# Patient Record
Sex: Male | Born: 1950 | Race: White | Hispanic: No | Marital: Married | Smoking: Never smoker
Health system: Southern US, Community
[De-identification: ages and names within clinical notes are randomized; demographics above are authoritative.]

## PROBLEM LIST (undated history)

## (undated) DIAGNOSIS — R350 Frequency of micturition: Secondary | ICD-10-CM

## (undated) DIAGNOSIS — Z8669 Personal history of other diseases of the nervous system and sense organs: Secondary | ICD-10-CM

## (undated) DIAGNOSIS — H9191 Unspecified hearing loss, right ear: Secondary | ICD-10-CM

## (undated) DIAGNOSIS — R351 Nocturia: Secondary | ICD-10-CM

## (undated) DIAGNOSIS — J45909 Unspecified asthma, uncomplicated: Secondary | ICD-10-CM

## (undated) DIAGNOSIS — Z860100 Personal history of colon polyps, unspecified: Secondary | ICD-10-CM

## (undated) DIAGNOSIS — R51 Headache: Secondary | ICD-10-CM

## (undated) DIAGNOSIS — R011 Cardiac murmur, unspecified: Secondary | ICD-10-CM

## (undated) DIAGNOSIS — F439 Reaction to severe stress, unspecified: Secondary | ICD-10-CM

## (undated) DIAGNOSIS — I10 Essential (primary) hypertension: Secondary | ICD-10-CM

## (undated) DIAGNOSIS — J189 Pneumonia, unspecified organism: Secondary | ICD-10-CM

## (undated) DIAGNOSIS — C61 Malignant neoplasm of prostate: Secondary | ICD-10-CM

## (undated) DIAGNOSIS — T148XXA Other injury of unspecified body region, initial encounter: Secondary | ICD-10-CM

## (undated) HISTORY — PX: TONSILLECTOMY: SUR1361

## (undated) HISTORY — PX: SKIN GRAFT: SHX250

## (undated) HISTORY — PX: BACK SURGERY: SHX140

## (undated) HISTORY — PX: OTHER SURGICAL HISTORY: SHX169

## (undated) HISTORY — PX: MYRINGOTOMY: SUR874

## (undated) HISTORY — PX: COLONOSCOPY: SHX174

## (undated) HISTORY — DX: Malignant neoplasm of prostate: C61

---

## 1995-11-01 HISTORY — PX: OTHER SURGICAL HISTORY: SHX169

## 1998-06-11 ENCOUNTER — Inpatient Hospital Stay (HOSPITAL_COMMUNITY): Admission: EM | Admit: 1998-06-11 | Discharge: 1998-06-12 | Payer: Self-pay | Admitting: *Deleted

## 1998-06-16 ENCOUNTER — Encounter: Admission: RE | Admit: 1998-06-16 | Discharge: 1998-09-14 | Payer: Self-pay | Admitting: Family Medicine

## 1998-08-03 ENCOUNTER — Encounter: Payer: Self-pay | Admitting: *Deleted

## 1998-08-05 ENCOUNTER — Encounter: Payer: Self-pay | Admitting: *Deleted

## 1998-08-05 ENCOUNTER — Ambulatory Visit (HOSPITAL_COMMUNITY): Admission: RE | Admit: 1998-08-05 | Discharge: 1998-08-06 | Payer: Self-pay | Admitting: *Deleted

## 2002-10-31 DIAGNOSIS — T148XXA Other injury of unspecified body region, initial encounter: Secondary | ICD-10-CM

## 2002-10-31 HISTORY — DX: Other injury of unspecified body region, initial encounter: T14.8XXA

## 2003-01-08 ENCOUNTER — Encounter: Payer: Self-pay | Admitting: Family Medicine

## 2003-01-08 ENCOUNTER — Encounter: Admission: RE | Admit: 2003-01-08 | Discharge: 2003-01-08 | Payer: Self-pay | Admitting: Family Medicine

## 2003-11-01 HISTORY — PX: PROSTATECTOMY: SHX69

## 2003-11-01 HISTORY — PX: HERNIA REPAIR: SHX51

## 2004-03-08 ENCOUNTER — Encounter: Admission: RE | Admit: 2004-03-08 | Discharge: 2004-03-08 | Payer: Self-pay | Admitting: Urology

## 2004-03-11 ENCOUNTER — Ambulatory Visit: Admission: RE | Admit: 2004-03-11 | Discharge: 2004-04-05 | Payer: Self-pay | Admitting: Radiation Oncology

## 2004-04-08 ENCOUNTER — Encounter (INDEPENDENT_AMBULATORY_CARE_PROVIDER_SITE_OTHER): Payer: Self-pay | Admitting: Specialist

## 2004-04-08 ENCOUNTER — Inpatient Hospital Stay (HOSPITAL_COMMUNITY): Admission: RE | Admit: 2004-04-08 | Discharge: 2004-04-11 | Payer: Self-pay | Admitting: Urology

## 2005-01-25 ENCOUNTER — Encounter: Admission: RE | Admit: 2005-01-25 | Discharge: 2005-01-25 | Payer: Self-pay | Admitting: Family Medicine

## 2005-10-28 ENCOUNTER — Ambulatory Visit: Payer: Self-pay | Admitting: Internal Medicine

## 2005-11-10 ENCOUNTER — Ambulatory Visit: Payer: Self-pay | Admitting: Internal Medicine

## 2005-11-10 ENCOUNTER — Encounter (INDEPENDENT_AMBULATORY_CARE_PROVIDER_SITE_OTHER): Payer: Self-pay | Admitting: Specialist

## 2006-03-25 ENCOUNTER — Emergency Department (HOSPITAL_COMMUNITY): Admission: EM | Admit: 2006-03-25 | Discharge: 2006-03-25 | Payer: Self-pay | Admitting: Emergency Medicine

## 2006-09-25 ENCOUNTER — Encounter: Admission: RE | Admit: 2006-09-25 | Discharge: 2006-09-25 | Payer: Self-pay | Admitting: Family Medicine

## 2009-12-30 ENCOUNTER — Encounter (HOSPITAL_COMMUNITY): Admission: RE | Admit: 2009-12-30 | Discharge: 2010-01-29 | Payer: Self-pay | Admitting: Podiatry

## 2010-11-10 ENCOUNTER — Encounter: Payer: Self-pay | Admitting: Internal Medicine

## 2010-12-02 NOTE — Letter (Signed)
Summary: Colonoscopy Date Change Letter  [facility]  [address] [place], [place] [address]   Phone: [phone]  Fax: [phone]      November 10, 2010 MRN: [id number]   Kerry Vasquez [address] [place], [place]  [address]   Dear Mr. Sebesta,   Previously you were recommended to have a repeat colonoscopy around this time. Your chart was recently reviewed by Dr. Lina Sar of [facility]. Follow up colonoscopy is now recommended in January 2014. This revised recommendation is based on current, nationally recognized guidelines for colorectal cancer screening and polyp surveillance. These guidelines are endorsed by the American Cancer Society, [employer] on Colorectal Cancer as well as numerous other major medical organizations.  Please understand that our recommendation assumes that you do not have any new symptoms such as bleeding, a change in bowel habits, anemia, or significant abdominal discomfort. If you do have any concerning GI symptoms or want to discuss the guideline recommendations, please call to arrange an office visit at your earliest convenience. Otherwise we will keep you in our reminder system and contact you 1-2 months prior to the date listed above to schedule your next colonoscopy.  Thank you,  Hedwig Morton. Juanda Chance, M.D.  [facility] [phone]

## 2011-03-18 NOTE — Op Note (Signed)
NAME:  Kerry Vasquez, Kerry Vasquez                      ACCOUNT NO.:  [id number]   MEDICAL RECORD NO.:  [id number]                   PATIENT TYPE:  INP   LOCATION:  X001                                 FACILITY:  [facility]   PHYSICIAN:  Excell Seltzer. Annabell Howells, M.D.                 DATE OF BIRTH:  Mar 06, 1951   DATE OF PROCEDURE:  04/08/2004  DATE OF DISCHARGE:                                 OPERATIVE REPORT   PREOPERATIVE DIAGNOSES:  Prostate cancer (pre PSA 2.8, Gleason 7).   POSTOPERATIVE DIAGNOSES:  Prostate cancer (pre PSA 2.8, Gleason 7).   PROCEDURE:  Radical retropubic prostatectomy with bilateral pelvic lymph  node dissection.   ATTENDING SURGEON:  Excell Seltzer. Annabell Howells, M.D.   RESIDENT SURGEON:  Rhae Lerner, M.D.   ANESTHESIA:  General endotracheal anesthesia.   ESTIMATED BLOOD LOSS:  900 mL.   COMPLICATIONS:  None.   INDICATIONS FOR PROCEDURE:  Mr. Hitchner is a 60 year old white male who  presented to the urology center as a referral from an outside physician for  elevated PSA.  Over the past year, the patient's PSA had risen from 2.2 to  2.8.  Significantly Mr. Voiles has a family history of prostate cancer  including two brothers and two half brothers who have had prostate cancer  one of which died at age 81.  Mr. Arocho was evaluated and on physical  examination was noted to have a small nodule on the left lobe of his  prostate.  A transrectal ultrasound guided biopsy was subsequent performed  which demonstrated adenocarcinoma of the prostate, Gleason 7.  After a long  discussion with Mr. Larmer concerning the implications of prostate cancer  in a man his age, he has decided to proceed with radical retropubic  prostatectomy and bilateral pelvic lymph node dissection.   DESCRIPTION OF PROCEDURE:  The patient was brought to the operating room and  following induction of general endotracheal anesthesia was placed in the  supine position and prepped and draped in the usual  sterile fashion. A 16  French Foley catheter was placed to straight drain.  A lower abdominal  midline incision was subsequently performed from a location approximately 3  cm inferior to the umbilicus to a point directly overlying the pubic bone.  Dissection was carried down through the patient's significant amount of  truncal adiposity until the rectus fascia was reached and divided.  The  rectus muscles were subsequently retracted laterally and the transversalis  fascia opened.  At this point, the space of Retzius was developed in such a  way that the anterior cervix of the bladder and prostate were easily visible  and the internal iliac vessels were easily visible within the operative  field. At this point, a Bookwalter retractor was placed with the retractor  blades first set to expose the right pelvic lymph nodes.  Please note that  the remainder of this operation was significantly complicated by  the fact  that the patient had a large amount of abdominal fat requiring long  instruments and making the dissection much more difficult than normal. The  right pelvic lymph nodes were subsequently dissected away from the right  iliac vessels and right obturator nerve.  Once the packet of lymph nodes had  been dissected away from the surrounding tissues, they were removed from the  field and sent for permanent section.  The retractor laser subsequently  reset to expose the left pelvic lymph nodes. Again the left pelvic lymph  nodes were dissected away from the iliac vessels and the obturator nerve.  Once all surrounding tissues were dissected away from the left pelvic lymph  node packet, it was also removed from the field and sent for permanent  section.  At this point, the retractor laser was set in such a way to expose  the distal anterior surface of the bladder, the bladder neck and the  prostate.  A significant amount of fat was present overlying the anterior  surface of the prostate and  this was carefully dissected away in order to  reveal the endopelvic fascia. The endopelvic fascia was entered bilaterally  near the sidewall and a plane developed between the sidewall and the lateral  surfaces of the prostate.  A 1-0 Vicryl was subsequently passed through the  dorsal venous complex and tied off.  A second 1-0 Vicryl was passed  proximally to tie off the vessels on the anterior surface of the bladder to  prevent __________ bleeding.  A third 1-0 Vicryl was used off the dorsal  venous complex and the complex subsequently divided down to the anterior  surface of the urethra just distal to the apex of the prostate.  The urethra  was subsequently opened with a 15 blade with a long handle to expose the  Foley catheter. The Foley catheter was subsequently apprehended and the  valve stem cut away in order to pull the prostate through the urethra and  the surgical field.  At this point, the remainder of the urethra was divided  using Metzenbaum scissors and hemostasis obtained.  The posterior surface of  the prostate was subsequently dissected away from the anterior surface of  the rectum by meticulous blunt dissection as well as division back to the  urethralis muscles holding the prostate to the anterior rectal surface. Once  the prostate had been fully dissected off of the rectum, the ampulla of the  vas deferens as well as the seminal vesicles were identified, dissected away  from their investing tissues and divided.  Attention was now focused on the  anterior surface of the prostate and bladder neck. The dissection was  carried down through the bladder neck until the Foley catheter was  identified. At this point, the catheter was pulled out of the bladder and  into the operative field. Dissection was carried down to the posterior  bladder neck until the anterior surface of the rectum was reached.  A few  small adherent tissues were subsequently clipped and divided and  the specimen delivered out of the operative field and sent for pathology.  The  operative field was again checked for any residual bleeding and hemostasis  obtained with a combination of Bovie cautery and hemoclips.  At this point,  the bladder neck was tailored by everting the mucosa using a number of  interrupted 4-0 chromic sutures and a tennis racquet procedure used to  narrow the bladder neck posteriorly.  At this  point, our attention was  focused on creation of the anastomosis. 2-0 Vicryl sutures were used to  place interrupted sutures at the 12 o'clock and to 5 and 7 positions. Once  the anastomotic sutures were in place, the bladder neck was pulled distally  and held taut against the urethral stump via a 16 French Foley catheter  placed prior to tying the anastomotic sutures.  The anastomotic sutures were  subsequently tied down and the bladder gently filled with saline  demonstrating a water tight anastomosis. At this point, the Bookwalter  retractor was removed and a JP drain placed in the left lower quadrant  through a separate incision.  The wound was subsequently closed using #1 PDS  in a running fashion to close the fascia followed by skin staples to  reapproximate the skin.  At this point, Dr. Earlene Plater entered the room to  perform an abdominal hernia repair; however, the prostatectomy portion of  the case was ended. The patient tolerated the procedure well and was stable  for his second procedure.  There were no complications. Please note that Dr.  Bjorn Pippin was present for the entire case and participated in all aspects  of the procedure.     Rhae Lerner, M.D.               Excell Seltzer. Annabell Howells, M.D.    JJP/MEDQ  D:  04/08/2004  T:  04/08/2004  Job:  [id number]

## 2011-03-18 NOTE — Discharge Summary (Signed)
NAME:  Kerry Vasquez, Kerry Vasquez                      ACCOUNT NO.:  [id number]   MEDICAL RECORD NO.:  [id number]                   PATIENT TYPE:  INP   LOCATION:  0365                                 FACILITY:  [facility]   PHYSICIAN:  Excell Seltzer. Annabell Howells, M.D.                 DATE OF BIRTH:  [birth year]   DATE OF ADMISSION:  04/08/2004  DATE OF DISCHARGE:  04/11/2004                                 DISCHARGE SUMMARY   ADMISSION DIAGNOSIS:  Prostate cancer.   DISCHARGE DIAGNOSIS:  Prostate cancer.   PROCEDURES PERFORMED:  Radical retropubic prostatectomy with bilateral  pelvic lymph node dissection.   BRIEF HISTORY OF PRESENT ILLNESS:  Mr. Geister is a 60 year old white male  first elevated for an elevated PSA.  The patient's PSA rose from 2.2 to 2.8  with a 9% free percentage over a one-year period.  Transrectal ultrasound-  guided prostate biopsy was then performed which revealed a Gleason 3 + 4 = 7  adenocarcinoma of the prostate.  Several alternatives were discussed with  the patient, including surgery, external beam radiation seeds, watchful  waiting, hormonal ablation, as well as other experimental procedures.  He  has elected to proceed with surgical therapy as a definitive management of  his prostate cancer.   PAST MEDICAL HISTORY:  He has a past medical history which is significant  for hypertension.   FAMILY HISTORY:  The patient has a strong family history of prostate cancer.   HOSPITAL COURSE:  Following admission, the patient was taken to the  operating room where he underwent a radical retropubic prostatectomy with  bilateral pelvic lymph node dissection.  The patient tolerated the procedure  well and there were no complications.  For a full summary of the surgical  procedure, please see the dictated operative note dated April 08, 2004.  Postoperatively, the patient was transferred to the floor where he was begun  on clear liquids.  The patient's IV fluids were decreased on  postoperative  day #1.  He began ambulating and was given a Dulcolax suppository to  stimulate a bowel movement.  By postoperative day #2, the patient's IV  fluids were heparin locked and he was continuing to ambulate.  By  postoperative day #3, the patient was passing flatus, as well as having  bowel movements.  The remainder of the hospital course was uneventful.   DIET:  The patient was discharged home on a regular diet.   ACTIVITY:  His activity level is restricted and to include no bending,  stooping, lifting, driving or moving heavy objects.   MEDICATIONS ON DISCHARGE:  1.  Levaquin.  2.  Tylox.   CONDITION ON DISCHARGE:  On the day of discharge, the patient was able to  tolerate a p.o. diet, ambulate without assistance and void without  difficulty.   FOLLOWUP:  The patient is asked to call Dr. Belva Crome office or the urology  physician on call with  any further questions or concerns.  This includes a  temperature greater than 101.5 degrees, as well as any erythema, drainage or  opening of the patient's surgical wound.   A followup appointment has been scheduled with Dr. Annabell Howells for Foley catheter  removal.  The patient was asked to call [phone] to ensure his appointment  is scheduled appropriately.     Thyra Breed, MD                            Excell Seltzer. Annabell Howells, M.D.    EG/MEDQ  D:  07/14/2004  T:  07/14/2004  Job:  [id number]

## 2011-04-15 ENCOUNTER — Ambulatory Visit (INDEPENDENT_AMBULATORY_CARE_PROVIDER_SITE_OTHER): Payer: PRIVATE HEALTH INSURANCE | Admitting: Urology

## 2011-04-15 DIAGNOSIS — N529 Male erectile dysfunction, unspecified: Secondary | ICD-10-CM

## 2011-04-15 DIAGNOSIS — Z8546 Personal history of malignant neoplasm of prostate: Secondary | ICD-10-CM

## 2012-03-28 ENCOUNTER — Other Ambulatory Visit: Payer: Self-pay | Admitting: Neurosurgery

## 2012-03-28 DIAGNOSIS — D329 Benign neoplasm of meninges, unspecified: Secondary | ICD-10-CM

## 2012-03-29 ENCOUNTER — Other Ambulatory Visit: Payer: Self-pay | Admitting: Neurosurgery

## 2012-03-29 DIAGNOSIS — Z77018 Contact with and (suspected) exposure to other hazardous metals: Secondary | ICD-10-CM

## 2012-05-04 ENCOUNTER — Ambulatory Visit
Admission: RE | Admit: 2012-05-04 | Discharge: 2012-05-04 | Disposition: A | Discharge status: Home / Self Care | Payer: PRIVATE HEALTH INSURANCE | Source: Ambulatory Visit | Attending: Neurosurgery | Admitting: Neurosurgery

## 2012-05-04 DIAGNOSIS — Z77018 Contact with and (suspected) exposure to other hazardous metals: Secondary | ICD-10-CM

## 2012-05-04 DIAGNOSIS — D329 Benign neoplasm of meninges, unspecified: Secondary | ICD-10-CM

## 2012-05-04 MED ORDER — GADOBENATE DIMEGLUMINE 529 MG/ML IV SOLN
20.0000 mL | Freq: Once | INTRAVENOUS | Status: AC | PRN
  Administered 2012-05-04: 20 mL via INTRAVENOUS

## 2012-05-23 ENCOUNTER — Other Ambulatory Visit: Payer: Self-pay | Admitting: Neurosurgery

## 2012-05-24 ENCOUNTER — Encounter (HOSPITAL_COMMUNITY): Payer: Self-pay | Admitting: Pharmacy Technician

## 2012-05-28 NOTE — Pre-Procedure Instructions (Signed)
20 Kerry Vasquez  05/28/2012   Your procedure is scheduled on:  Tues, Aug 6 @ 7:30 AM  Report to [facility] at 5:30 AM.  Call this number if you have problems the morning of surgery: [phone]   Remember:   Do not eat food:After Midnight.    Take these medicines the morning of surgery with A SIP OF WATER: Amlodipine(Norvasc)   Do not wear jewelry  Do not wear lotions, powders, or colognes  Men may shave face and neck.  Do not bring valuables to the hospital.  Contacts, dentures or bridgework may not be worn into surgery.  Leave suitcase in the car. After surgery it may be brought to your room.  For patients admitted to the hospital, checkout time is 11:00 AM the day of discharge.   Patients discharged the day of surgery will not be allowed to drive home.  Special Instructions: CHG Shower Use Special Wash: 1/2 bottle night before surgery and 1/2 bottle morning of surgery.   Please read over the following fact sheets that you were given: Pain Booklet, Coughing and Deep Breathing, Blood Transfusion Information, MRSA Information and Surgical Site Infection Prevention

## 2012-05-28 NOTE — Pre-Procedure Instructions (Signed)
20 Kerry Vasquez  05/28/2012   Your procedure is scheduled on:  August 6th  Report to [facility] at 0530 AM.  Call this number if you have problems the morning of surgery: [phone]   Remember:   Do not eat food or drink:After Midnight.  Take these medicines the morning of surgery with A SIP OF WATER: norvasc   Do not wear jewelry, make-up or nail polish.  Do not wear lotions, powders, or perfumes.   Do not shave 48 hours prior to surgery. Men may shave face and neck.  Do not bring valuables to the hospital.  Contacts, dentures or bridgework may not be worn into surgery.  Leave suitcase in the car. After surgery it may be brought to your room.  For patients admitted to the hospital, checkout time is 11:00 AM the day of discharge.   Special Instructions: CHG Shower Use Special Wash: 1/2 bottle night before surgery and 1/2 bottle morning of surgery.   Please read over the following fact sheets that you were given: Pain Booklet, Coughing and Deep Breathing, MRSA Information and Surgical Site Infection Prevention

## 2012-05-29 ENCOUNTER — Encounter (HOSPITAL_COMMUNITY)
Admission: RE | Admit: 2012-05-29 | Discharge: 2012-05-29 | Disposition: A | Discharge status: Home / Self Care | Payer: PRIVATE HEALTH INSURANCE | Source: Ambulatory Visit | Attending: Neurosurgery | Admitting: Neurosurgery

## 2012-05-29 ENCOUNTER — Ambulatory Visit (HOSPITAL_COMMUNITY)
Admission: RE | Admit: 2012-05-29 | Discharge: 2012-05-29 | Disposition: A | Discharge status: Home / Self Care | Payer: PRIVATE HEALTH INSURANCE | Source: Ambulatory Visit | Attending: Neurosurgery | Admitting: Neurosurgery

## 2012-05-29 ENCOUNTER — Encounter (HOSPITAL_COMMUNITY): Payer: Self-pay

## 2012-05-29 DIAGNOSIS — Z01818 Encounter for other preprocedural examination: Secondary | ICD-10-CM | POA: Insufficient documentation

## 2012-05-29 DIAGNOSIS — Z01812 Encounter for preprocedural laboratory examination: Secondary | ICD-10-CM | POA: Insufficient documentation

## 2012-05-29 HISTORY — DX: Reaction to severe stress, unspecified: F43.9

## 2012-05-29 HISTORY — DX: Nocturia: R35.1

## 2012-05-29 HISTORY — DX: Unspecified hearing loss, right ear: H91.91

## 2012-05-29 HISTORY — DX: Personal history of colonic polyps: Z86.010

## 2012-05-29 HISTORY — DX: Unspecified asthma, uncomplicated: J45.909

## 2012-05-29 HISTORY — DX: Essential (primary) hypertension: I10

## 2012-05-29 HISTORY — DX: Personal history of other diseases of the nervous system and sense organs: Z86.69

## 2012-05-29 HISTORY — DX: Pneumonia, unspecified organism: J18.9

## 2012-05-29 HISTORY — DX: Headache: R51

## 2012-05-29 HISTORY — DX: Other injury of unspecified body region, initial encounter: T14.8XXA

## 2012-05-29 HISTORY — DX: Cardiac murmur, unspecified: R01.1

## 2012-05-29 HISTORY — DX: Frequency of micturition: R35.0

## 2012-05-29 HISTORY — DX: Personal history of colon polyps, unspecified: Z86.0100

## 2012-05-29 LAB — CBC WITH DIFFERENTIAL/PLATELET
Basophils Relative: 1 % (ref 0–1)
Hemoglobin: 13.8 g/dL (ref 13.0–17.0)
Lymphocytes Relative: 33 % (ref 12–46)
Lymphs Abs: 2.7 10*3/uL (ref 0.7–4.0)
Monocytes Relative: 8 % (ref 3–12)
Neutro Abs: 4.6 10*3/uL (ref 1.7–7.7)
Neutrophils Relative %: 57 % (ref 43–77)
RBC: 5.06 MIL/uL (ref 4.22–5.81)
WBC: 8 10*3/uL (ref 4.0–10.5)

## 2012-05-29 LAB — BASIC METABOLIC PANEL
Calcium: 9.5 mg/dL (ref 8.4–10.5)
Creatinine, Ser: 0.72 mg/dL (ref 0.50–1.35)
Potassium: 4.1 mEq/L (ref 3.5–5.1)

## 2012-05-29 LAB — TYPE AND SCREEN: Antibody Screen: NEGATIVE

## 2012-05-29 LAB — SURGICAL PCR SCREEN: MRSA, PCR: NEGATIVE

## 2012-05-29 NOTE — Progress Notes (Signed)
Pt doesn't have a cardiologist  Denies ever having an echo/stress test/heart cath  Medical MD Dr.Robert Inger with [facility]  Denies recent EKG or CXR

## 2012-06-04 MED ORDER — VANCOMYCIN HCL 1000 MG IV SOLR
1500.0000 mg | INTRAVENOUS | Status: AC
  Administered 2012-06-05: 1500 mg via INTRAVENOUS
  Filled 2012-06-04 (×2): qty 1500

## 2012-06-04 MED ORDER — DEXAMETHASONE SODIUM PHOSPHATE 10 MG/ML IJ SOLN
10.0000 mg | INTRAMUSCULAR | Status: AC
  Administered 2012-06-05: 10 mg via INTRAVENOUS
  Filled 2012-06-04: qty 1

## 2012-06-05 ENCOUNTER — Encounter (HOSPITAL_COMMUNITY): Payer: Self-pay | Admitting: Surgery

## 2012-06-05 ENCOUNTER — Encounter (HOSPITAL_COMMUNITY)
Admission: RE | Disposition: A | Discharge status: Home / Self Care | Payer: Self-pay | Source: Ambulatory Visit | Attending: Neurosurgery

## 2012-06-05 ENCOUNTER — Inpatient Hospital Stay (HOSPITAL_COMMUNITY)
Admission: RE | Admit: 2012-06-05 | Discharge: 2012-06-07 | DRG: 027 | Disposition: A | Discharge status: Home / Self Care | Payer: PRIVATE HEALTH INSURANCE | Source: Ambulatory Visit | Attending: Neurosurgery | Admitting: Neurosurgery

## 2012-06-05 ENCOUNTER — Encounter (HOSPITAL_COMMUNITY): Payer: Self-pay | Admitting: Vascular Surgery

## 2012-06-05 ENCOUNTER — Ambulatory Visit (HOSPITAL_COMMUNITY): Payer: PRIVATE HEALTH INSURANCE | Admitting: Vascular Surgery

## 2012-06-05 ENCOUNTER — Encounter (HOSPITAL_COMMUNITY): Payer: Self-pay

## 2012-06-05 DIAGNOSIS — I10 Essential (primary) hypertension: Secondary | ICD-10-CM | POA: Diagnosis present

## 2012-06-05 DIAGNOSIS — E119 Type 2 diabetes mellitus without complications: Secondary | ICD-10-CM | POA: Diagnosis present

## 2012-06-05 DIAGNOSIS — D329 Benign neoplasm of meninges, unspecified: Secondary | ICD-10-CM | POA: Diagnosis present

## 2012-06-05 DIAGNOSIS — Z8546 Personal history of malignant neoplasm of prostate: Secondary | ICD-10-CM

## 2012-06-05 DIAGNOSIS — Z860100 Personal history of colon polyps, unspecified: Secondary | ICD-10-CM

## 2012-06-05 DIAGNOSIS — D320 Benign neoplasm of cerebral meninges: Principal | ICD-10-CM | POA: Diagnosis present

## 2012-06-05 DIAGNOSIS — Z79899 Other long term (current) drug therapy: Secondary | ICD-10-CM

## 2012-06-05 DIAGNOSIS — J45909 Unspecified asthma, uncomplicated: Secondary | ICD-10-CM | POA: Diagnosis present

## 2012-06-05 DIAGNOSIS — H9190 Unspecified hearing loss, unspecified ear: Secondary | ICD-10-CM | POA: Diagnosis present

## 2012-06-05 DIAGNOSIS — Z87891 Personal history of nicotine dependence: Secondary | ICD-10-CM

## 2012-06-05 HISTORY — PX: CRANIOTOMY: SHX93

## 2012-06-05 SURGERY — CRANIOTOMY TUMOR EXCISION
Anesthesia: General | Site: Head | Laterality: Right | Wound class: Clean

## 2012-06-05 MED ORDER — CEFAZOLIN SODIUM-DEXTROSE 2-3 GM-% IV SOLR
INTRAVENOUS | Status: AC
  Filled 2012-06-05: qty 50

## 2012-06-05 MED ORDER — HYDROCODONE-ACETAMINOPHEN 5-325 MG PO TABS
1.0000 | ORAL_TABLET | ORAL | Status: DC | PRN
  Administered 2012-06-05 – 2012-06-07 (×6): 1 via ORAL
  Filled 2012-06-05 (×6): qty 1

## 2012-06-05 MED ORDER — LABETALOL HCL 5 MG/ML IV SOLN
10.0000 mg | INTRAVENOUS | Status: DC | PRN
  Administered 2012-06-05: 10 mg via INTRAVENOUS
  Administered 2012-06-05: 20 mg via INTRAVENOUS
  Filled 2012-06-05 (×2): qty 4

## 2012-06-05 MED ORDER — LABETALOL HCL 5 MG/ML IV SOLN
INTRAVENOUS | Status: DC | PRN
  Administered 2012-06-05: 2.5 mg via INTRAVENOUS
  Administered 2012-06-05 (×2): 5 mg via INTRAVENOUS

## 2012-06-05 MED ORDER — DEXAMETHASONE 6 MG PO TABS
6.0000 mg | ORAL_TABLET | Freq: Four times a day (QID) | ORAL | Status: AC
  Administered 2012-06-05 – 2012-06-06 (×4): 6 mg via ORAL
  Filled 2012-06-05 (×5): qty 1

## 2012-06-05 MED ORDER — INSULIN ASPART 100 UNIT/ML ~~LOC~~ SOLN: 0.0000 [IU] | Freq: Every day | SUBCUTANEOUS | Status: DC

## 2012-06-05 MED ORDER — HYDROMORPHONE HCL PF 1 MG/ML IJ SOLN
0.5000 mg | INTRAMUSCULAR | Status: DC | PRN
  Administered 2012-06-05 – 2012-06-06 (×5): 1 mg via INTRAVENOUS
  Administered 2012-06-06: 0.5 mg via INTRAVENOUS
  Filled 2012-06-05 (×6): qty 1

## 2012-06-05 MED ORDER — MUPIROCIN 2 % EX OINT
1.0000 "application " | TOPICAL_OINTMENT | Freq: Two times a day (BID) | CUTANEOUS | Status: DC
  Administered 2012-06-05 – 2012-06-06 (×4): 1 via NASAL
  Filled 2012-06-05 (×2): qty 22

## 2012-06-05 MED ORDER — DEXAMETHASONE 4 MG PO TABS
4.0000 mg | ORAL_TABLET | Freq: Four times a day (QID) | ORAL | Status: DC
  Administered 2012-06-06: 4 mg via ORAL
  Filled 2012-06-05 (×4): qty 1

## 2012-06-05 MED ORDER — PROMETHAZINE HCL 25 MG/ML IJ SOLN: 6.2500 mg | INTRAMUSCULAR | Status: DC | PRN

## 2012-06-05 MED ORDER — FENTANYL CITRATE 0.05 MG/ML IJ SOLN
INTRAMUSCULAR | Status: DC | PRN
  Administered 2012-06-05: 50 ug via INTRAVENOUS
  Administered 2012-06-05: 200 ug via INTRAVENOUS
  Administered 2012-06-05: 50 ug via INTRAVENOUS

## 2012-06-05 MED ORDER — ACETAMINOPHEN 325 MG PO TABS: 650.0000 mg | ORAL_TABLET | ORAL | Status: DC | PRN

## 2012-06-05 MED ORDER — MIDAZOLAM HCL 2 MG/2ML IJ SOLN: 0.5000 mg | Freq: Once | INTRAMUSCULAR | Status: DC | PRN

## 2012-06-05 MED ORDER — ADULT MULTIVITAMIN W/MINERALS CH
1.0000 | ORAL_TABLET | Freq: Every day | ORAL | Status: DC
  Administered 2012-06-05 – 2012-06-07 (×3): 1 via ORAL
  Filled 2012-06-05 (×3): qty 1

## 2012-06-05 MED ORDER — NEOSTIGMINE METHYLSULFATE 1 MG/ML IJ SOLN
INTRAMUSCULAR | Status: DC | PRN
  Administered 2012-06-05: 5 mg via INTRAVENOUS

## 2012-06-05 MED ORDER — DEXAMETHASONE 4 MG PO TABS: 4.0000 mg | ORAL_TABLET | Freq: Three times a day (TID) | ORAL | Status: DC

## 2012-06-05 MED ORDER — LORAZEPAM 1 MG PO TABS: 1.0000 mg | ORAL_TABLET | Freq: Two times a day (BID) | ORAL | Status: DC | PRN

## 2012-06-05 MED ORDER — MANNITOL 20 % IV SOLN
INTRAVENOUS | Status: DC | PRN
  Administered 2012-06-05: 08:00:00 via INTRAVENOUS

## 2012-06-05 MED ORDER — SODIUM CHLORIDE 0.9 % IV SOLN
INTRAVENOUS | Status: DC | PRN
  Administered 2012-06-05: 07:00:00 via INTRAVENOUS

## 2012-06-05 MED ORDER — ACETAMINOPHEN 650 MG RE SUPP: 650.0000 mg | RECTAL | Status: DC | PRN

## 2012-06-05 MED ORDER — ESMOLOL HCL 10 MG/ML IV SOLN
INTRAVENOUS | Status: DC | PRN
  Administered 2012-06-05: 30 mg via INTRAVENOUS

## 2012-06-05 MED ORDER — LACTATED RINGERS IV SOLN: INTRAVENOUS | Status: DC | PRN

## 2012-06-05 MED ORDER — PANTOPRAZOLE SODIUM 40 MG IV SOLR
40.0000 mg | Freq: Every day | INTRAVENOUS | Status: DC
  Administered 2012-06-05: 40 mg via INTRAVENOUS
  Filled 2012-06-05 (×2): qty 40

## 2012-06-05 MED ORDER — BACITRACIN ZINC 500 UNIT/GM EX OINT
TOPICAL_OINTMENT | CUTANEOUS | Status: DC | PRN
  Administered 2012-06-05: 1 via TOPICAL

## 2012-06-05 MED ORDER — PROMETHAZINE HCL 25 MG PO TABS: 12.5000 mg | ORAL_TABLET | ORAL | Status: DC | PRN

## 2012-06-05 MED ORDER — MIDAZOLAM HCL 5 MG/5ML IJ SOLN
INTRAMUSCULAR | Status: DC | PRN
  Administered 2012-06-05: 2 mg via INTRAVENOUS

## 2012-06-05 MED ORDER — 0.9 % SODIUM CHLORIDE (POUR BTL) OPTIME
TOPICAL | Status: DC | PRN
  Administered 2012-06-05 (×2): 1000 mL

## 2012-06-05 MED ORDER — ONDANSETRON HCL 4 MG PO TABS: 4.0000 mg | ORAL_TABLET | ORAL | Status: DC | PRN

## 2012-06-05 MED ORDER — ONDANSETRON HCL 4 MG/2ML IJ SOLN
4.0000 mg | INTRAMUSCULAR | Status: DC | PRN
  Administered 2012-06-05 – 2012-06-06 (×4): 4 mg via INTRAVENOUS
  Filled 2012-06-05 (×4): qty 2

## 2012-06-05 MED ORDER — ROCURONIUM BROMIDE 100 MG/10ML IV SOLN
INTRAVENOUS | Status: DC | PRN
  Administered 2012-06-05: 50 mg via INTRAVENOUS
  Administered 2012-06-05: 10 mg via INTRAVENOUS
  Administered 2012-06-05 (×2): 20 mg via INTRAVENOUS

## 2012-06-05 MED ORDER — THROMBIN 20000 UNITS EX KIT
PACK | CUTANEOUS | Status: DC | PRN
  Administered 2012-06-05: 09:00:00 via TOPICAL

## 2012-06-05 MED ORDER — ONDANSETRON HCL 4 MG/2ML IJ SOLN
INTRAMUSCULAR | Status: DC | PRN
  Administered 2012-06-05: 4 mg via INTRAVENOUS

## 2012-06-05 MED ORDER — SODIUM CHLORIDE 0.9 % IR SOLN
Status: DC | PRN
  Administered 2012-06-05: 09:00:00

## 2012-06-05 MED ORDER — GLYCOPYRROLATE 0.2 MG/ML IJ SOLN
INTRAMUSCULAR | Status: DC | PRN
  Administered 2012-06-05: .8 mg via INTRAVENOUS

## 2012-06-05 MED ORDER — NALOXONE HCL 0.4 MG/ML IJ SOLN: 0.0800 mg | INTRAMUSCULAR | Status: DC | PRN

## 2012-06-05 MED ORDER — DOCUSATE SODIUM 100 MG PO CAPS
100.0000 mg | ORAL_CAPSULE | Freq: Two times a day (BID) | ORAL | Status: DC
  Administered 2012-06-05 – 2012-06-07 (×5): 100 mg via ORAL
  Filled 2012-06-05 (×6): qty 1

## 2012-06-05 MED ORDER — HYDROMORPHONE HCL PF 1 MG/ML IJ SOLN
0.2500 mg | INTRAMUSCULAR | Status: DC | PRN
  Administered 2012-06-05 (×2): 0.5 mg via INTRAVENOUS

## 2012-06-05 MED ORDER — SODIUM CHLORIDE 0.9 % IV SOLN
INTRAVENOUS | Status: AC
  Filled 2012-06-05: qty 500

## 2012-06-05 MED ORDER — CEFAZOLIN SODIUM-DEXTROSE 2-3 GM-% IV SOLR
2.0000 g | Freq: Three times a day (TID) | INTRAVENOUS | Status: AC
  Administered 2012-06-05 (×2): 2 g via INTRAVENOUS
  Filled 2012-06-05 (×2): qty 50

## 2012-06-05 MED ORDER — BACITRACIN 50000 UNITS IM SOLR
INTRAMUSCULAR | Status: AC
  Filled 2012-06-05: qty 1

## 2012-06-05 MED ORDER — LISINOPRIL 40 MG PO TABS
40.0000 mg | ORAL_TABLET | Freq: Every day | ORAL | Status: DC
  Administered 2012-06-05 – 2012-06-07 (×3): 40 mg via ORAL
  Filled 2012-06-05 (×3): qty 1

## 2012-06-05 MED ORDER — SODIUM CHLORIDE 0.9 % IV SOLN
INTRAVENOUS | Status: DC | PRN
  Administered 2012-06-05: 08:00:00 via INTRAVENOUS

## 2012-06-05 MED ORDER — CHLORHEXIDINE GLUCONATE CLOTH 2 % EX PADS
6.0000 | MEDICATED_PAD | Freq: Every day | CUTANEOUS | Status: DC
  Administered 2012-06-05 – 2012-06-06 (×2): 6 via TOPICAL

## 2012-06-05 MED ORDER — MICROFIBRILLAR COLL HEMOSTAT EX PADS
MEDICATED_PAD | CUTANEOUS | Status: DC | PRN
  Administered 2012-06-05: 1 via TOPICAL

## 2012-06-05 MED ORDER — LIDOCAINE HCL (CARDIAC) 20 MG/ML IV SOLN
INTRAVENOUS | Status: DC | PRN
  Administered 2012-06-05: 50 mg via INTRAVENOUS

## 2012-06-05 MED ORDER — INSULIN ASPART 100 UNIT/ML ~~LOC~~ SOLN
0.0000 [IU] | Freq: Three times a day (TID) | SUBCUTANEOUS | Status: DC
  Administered 2012-06-05 – 2012-06-06 (×3): 4 [IU] via SUBCUTANEOUS
  Administered 2012-06-06: 3 [IU] via SUBCUTANEOUS

## 2012-06-05 MED ORDER — AMLODIPINE BESYLATE 10 MG PO TABS
10.0000 mg | ORAL_TABLET | Freq: Every day | ORAL | Status: DC
  Administered 2012-06-05 – 2012-06-07 (×3): 10 mg via ORAL
  Filled 2012-06-05 (×3): qty 1

## 2012-06-05 MED ORDER — PROPOFOL 10 MG/ML IV EMUL
INTRAVENOUS | Status: DC | PRN
  Administered 2012-06-05: 200 mg via INTRAVENOUS
  Administered 2012-06-05: 100 mg via INTRAVENOUS

## 2012-06-05 MED ORDER — MEPERIDINE HCL 25 MG/ML IJ SOLN: 6.2500 mg | INTRAMUSCULAR | Status: DC | PRN

## 2012-06-05 MED ORDER — HYDROMORPHONE HCL PF 1 MG/ML IJ SOLN
INTRAMUSCULAR | Status: AC
  Filled 2012-06-05: qty 1

## 2012-06-05 SURGICAL SUPPLY — 68 items
0.25% MARCAINE IMPLANT
BAG DECANTER FOR FLEXI CONT (MISCELLANEOUS) ×2 IMPLANT
BLADE SURG ROTATE 9660 (MISCELLANEOUS) ×2 IMPLANT
BNDG COHESIVE 4X5 TAN NS LF (GAUZE/BANDAGES/DRESSINGS) IMPLANT
BRUSH SCRUB EZ 1% IODOPHOR (MISCELLANEOUS) IMPLANT
BRUSH SCRUB EZ PLAIN DRY (MISCELLANEOUS) ×2 IMPLANT
BUR ACORN 6.0 PRECISION (BURR) ×2 IMPLANT
BUR ROUTER D-58 CRANI (BURR) ×2 IMPLANT
CANISTER SUCTION 2500CC (MISCELLANEOUS) ×4 IMPLANT
CLIP TI MEDIUM 6 (CLIP) IMPLANT
CLOTH BEACON ORANGE TIMEOUT ST (SAFETY) ×2 IMPLANT
CONT SPEC 4OZ CLIKSEAL STRL BL (MISCELLANEOUS) ×4 IMPLANT
CORDS BIPOLAR (ELECTRODE) ×2 IMPLANT
DRAPE CAMERA VIDEO/LASER (DRAPES) IMPLANT
DRAPE MICROSCOPE ZEISS OPMI (DRAPES) IMPLANT
DRAPE NEUROLOGICAL W/INCISE (DRAPES) ×2 IMPLANT
DRAPE STERI IOBAN 125X83 (DRAPES) IMPLANT
DRAPE SURG 17X23 STRL (DRAPES) IMPLANT
DRAPE WARM FLUID 44X44 (DRAPE) ×2 IMPLANT
DRESSING TELFA 8X3 (GAUZE/BANDAGES/DRESSINGS) IMPLANT
DURAFORM SPONGE 2X2 SINGLE (Neuro Prosthesis/Implant) ×2 IMPLANT
ELECT CAUTERY BLADE 6.4 (BLADE) ×2 IMPLANT
ELECT REM PT RETURN 9FT ADLT (ELECTROSURGICAL) ×2
ELECTRODE REM PT RTRN 9FT ADLT (ELECTROSURGICAL) ×1 IMPLANT
GAUZE SPONGE 4X4 16PLY XRAY LF (GAUZE/BANDAGES/DRESSINGS) IMPLANT
GLOVE BIOGEL PI IND STRL 7.0 (GLOVE) ×2 IMPLANT
GLOVE BIOGEL PI INDICATOR 7.0 (GLOVE) ×2
GLOVE ECLIPSE 7.5 STRL STRAW (GLOVE) ×2 IMPLANT
GLOVE ECLIPSE 8.5 STRL (GLOVE) ×2 IMPLANT
GLOVE EXAM NITRILE LRG STRL (GLOVE) IMPLANT
GLOVE EXAM NITRILE MD LF STRL (GLOVE) ×4 IMPLANT
GLOVE EXAM NITRILE XL STR (GLOVE) IMPLANT
GLOVE EXAM NITRILE XS STR PU (GLOVE) IMPLANT
GLOVE SS BIOGEL STRL SZ 6.5 (GLOVE) ×2 IMPLANT
GLOVE SUPERSENSE BIOGEL SZ 6.5 (GLOVE) ×2
GLOVE SURG SS PI 6.5 STRL IVOR (GLOVE) ×2 IMPLANT
GOWN BRE IMP SLV AUR LG STRL (GOWN DISPOSABLE) IMPLANT
GOWN BRE IMP SLV AUR XL STRL (GOWN DISPOSABLE) IMPLANT
GOWN STRL REIN 2XL LVL4 (GOWN DISPOSABLE) IMPLANT
HEMOSTAT SURGICEL 2X14 (HEMOSTASIS) ×2 IMPLANT
KIT BASIN OR (CUSTOM PROCEDURE TRAY) ×2 IMPLANT
KIT ROOM TURNOVER OR (KITS) ×2 IMPLANT
NEEDLE HYPO 18GX1.5 BLUNT FILL (NEEDLE) IMPLANT
NEEDLE HYPO 25X1 1.5 SAFETY (NEEDLE) ×2 IMPLANT
NS IRRIG 1000ML POUR BTL (IV SOLUTION) ×4 IMPLANT
PACK CRANIOTOMY (CUSTOM PROCEDURE TRAY) ×2 IMPLANT
PAD ARMBOARD 7.5X6 YLW CONV (MISCELLANEOUS) ×6 IMPLANT
PATTIES SURGICAL .25X.25 (GAUZE/BANDAGES/DRESSINGS) IMPLANT
PATTIES SURGICAL .5 X.5 (GAUZE/BANDAGES/DRESSINGS) IMPLANT
PATTIES SURGICAL .5 X3 (DISPOSABLE) IMPLANT
PATTIES SURGICAL 1X1 (DISPOSABLE) IMPLANT
PLATE 1.5  2HOLE LNG NEURO (Plate) ×3 IMPLANT
PLATE 1.5 2HOLE LNG NEURO (Plate) ×3 IMPLANT
RUBBERBAND STERILE (MISCELLANEOUS) IMPLANT
SCREW SELF DRILL HT 1.5/4MM (Screw) ×12 IMPLANT
SPONGE GAUZE 4X4 12PLY (GAUZE/BANDAGES/DRESSINGS) ×2 IMPLANT
SPONGE NEURO XRAY DETECT 1X3 (DISPOSABLE) IMPLANT
SPONGE SURGIFOAM ABS GEL 100 (HEMOSTASIS) ×4 IMPLANT
STAPLER VISISTAT 35W (STAPLE) ×4 IMPLANT
SUT NURALON 4 0 TR CR/8 (SUTURE) ×2 IMPLANT
SUT VIC AB 2-0 CT2 18 VCP726D (SUTURE) ×6 IMPLANT
SYR 20ML ECCENTRIC (SYRINGE) ×2 IMPLANT
SYR CONTROL 10ML LL (SYRINGE) ×2 IMPLANT
TOWEL OR 17X24 6PK STRL BLUE (TOWEL DISPOSABLE) ×2 IMPLANT
TOWEL OR 17X26 10 PK STRL BLUE (TOWEL DISPOSABLE) ×2 IMPLANT
TRAY FOLEY CATH 14FRSI W/METER (CATHETERS) ×2 IMPLANT
UNDERPAD 30X30 INCONTINENT (UNDERPADS AND DIAPERS) IMPLANT
WATER STERILE IRR 1000ML POUR (IV SOLUTION) ×2 IMPLANT

## 2012-06-05 NOTE — Preoperative (Signed)
Beta Blockers   Reason not to administer Beta Blockers:Not Applicable 

## 2012-06-05 NOTE — Transfer of Care (Signed)
Immediate Anesthesia Transfer of Care Note  Patient: Kerry Vasquez  Procedure(s) Performed: Procedure(s) (LRB): CRANIOTOMY TUMOR EXCISION (Right)  Patient Location: PACU  Anesthesia Type: General  Level of Consciousness: awake, alert  and oriented  Airway & Oxygen Therapy: Patient Spontanous Breathing and Patient connected to face mask oxygen  Post-op Assessment: Report given to PACU RN and Post -op Vital signs reviewed and stable  Post vital signs: Reviewed and stable  Complications: No apparent anesthesia complications

## 2012-06-05 NOTE — Op Note (Signed)
Date of procedure: 06/05/2012  Date of dictation: Same  Service: Neurosurgery  Preoperative diagnosis: Right frontal meningioma  Postoperative diagnosis: Same  Procedure Name: Craniotomy with resection of right frontal meningioma  Surgeon:Wandra Babin A.Jeronimo Hellberg, M.D.  Asst. Surgeon: Ardath Sax  Anesthesia: General  Indication: Patient went incidentally discovered right frontal meningioma which is enlarged on serial scans. Patient presents now for craniotomy and resection of tumor.  Operative note: After induction anesthesia. Patient positioned in the supine position with his head fixed in a [place]field pin headrest and turned towards the left. Patient's right frontal and temporal scalp was prepped and draped sterilely. 10 blade used to make a modified bicoronal scalp incision extending from midline over to his right zygoma. Scalp flap in several spots were reflected anteriorly. Right pterional craniotomy performed using high-speed drill. Bone flap was elevated. Dura was opened and flapped along the floor of the anterior middle fossa. Tumor was readily apparent. This is then dissected free from the surrounding soft frontal lobe. Tumors removed completely. The brain itself looked good without evidence of any significant trauma. Margins of the dura were also resected. Wound is irrigated. Hemostasis was obtained. DuraGen was placed over the dural defect. Gelfoam was placed over the craniotomy defect. Craniotomy flap was reapproximated with titanium plates. Scalp and temporalis muscle reapproximated and staples were applied the surface. There were no apparent complications. Patient tolerated suture well and returned to recovery room postop.

## 2012-06-05 NOTE — Anesthesia Preprocedure Evaluation (Addendum)
Anesthesia Evaluation  Patient identified by MRN, date of birth, ID band Patient awake    Reviewed: Allergy & Precautions, H&P , NPO status , Patient's Chart, lab work & pertinent test results, reviewed documented beta blocker date and time   History of Anesthesia Complications Negative for: history of anesthetic complications  Airway Mallampati: II TM Distance: >3 FB Neck ROM: Full    Dental No notable dental hx. (+) Teeth Intact and Dental Advisory Given   Pulmonary asthma (childhood asthma) , pneumonia -, resolved, former smoker,  breath sounds clear to auscultation  Pulmonary exam normal       Cardiovascular hypertension, Pt. on medications + Valvular Problems/Murmurs Rhythm:Regular Rate:Normal     Neuro/Psych  Headaches, Vision changes with meningioma    GI/Hepatic negative GI ROS, Neg liver ROS,   Endo/Other  Well Controlled, Type obesity  Renal/GU negative Renal ROS     Musculoskeletal negative musculoskeletal ROS (+)   Abdominal (+) + obese,   Peds  Hematology negative hematology ROS (+)   Anesthesia Other Findings   Reproductive/Obstetrics negative OB ROS                         Anesthesia Physical Anesthesia Plan  ASA: III  Anesthesia Plan: General   Post-op Pain Management:    Induction: Intravenous  Airway Management Planned: Oral ETT  Additional Equipment: Arterial line  Intra-op Plan:   Post-operative Plan: Extubation in OR  Informed Consent: I have reviewed the patients History and Physical, chart, labs and discussed the procedure including the risks, benefits and alternatives for the proposed anesthesia with the patient or authorized representative who has indicated his/her understanding and acceptance.   Dental advisory given  Plan Discussed with: CRNA and Surgeon  Anesthesia Plan Comments: (Plan routine monitors, A line, GETA)        Anesthesia  Quick Evaluation

## 2012-06-05 NOTE — Brief Op Note (Signed)
06/05/2012  9:53 AM  PATIENT:  Kerry Vasquez  61 y.o. male  PRE-OPERATIVE DIAGNOSIS:  Right Meningioma  POST-OPERATIVE DIAGNOSIS:  Right Meningioma     PROCEDURE:  Procedure(s) (LRB): CRANIOTOMY TUMOR EXCISION (Right)  SURGEON:  Surgeon(s) and Role:    * Temple Pacini, MD - Primary    * Reinaldo Meeker, MD - Assisting  PHYSICIAN ASSISTANT:   ASSISTANTS:    ANESTHESIA:   general  EBL:  Total I/O In: 1700 [I.V.:1400; IV Piggyback:300] Out: 1100 [Urine:850; Blood:250]  BLOOD ADMINISTERED:none  DRAINS: none   LOCAL MEDICATIONS USED:  NONE  SPECIMEN:  No Specimen  DISPOSITION OF SPECIMEN:  N/A  COUNTS:  YES  TOURNIQUET:  * No tourniquets in log *  DICTATION: .Dragon Dictation  PLAN OF CARE: Admit to inpatient   PATIENT DISPOSITION:  PACU - hemodynamically stable.   Delay start of Pharmacological VTE agent (>24hrs) due to surgical blood loss or risk of bleeding: yes
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
## 2012-06-05 NOTE — Progress Notes (Signed)
UR completed 

## 2012-06-05 NOTE — Anesthesia Postprocedure Evaluation (Signed)
  Anesthesia Post-op Note  Patient: Kerry Vasquez  Procedure(s) Performed: Procedure(s) (LRB): CRANIOTOMY TUMOR EXCISION (Right)  Patient Location: PACU  Anesthesia Type: General  Level of Consciousness: awake, alert  and oriented  Airway and Oxygen Therapy: Patient Spontanous Breathing and Patient connected to nasal cannula oxygen  Post-op Pain: none  Post-op Assessment: Post-op Vital signs reviewed, Patient's Cardiovascular Status Stable, Respiratory Function Stable, Patent Airway, No signs of Nausea or vomiting and Pain level controlled  Post-op Vital Signs: Reviewed and stable  Complications: No apparent anesthesia complications

## 2012-06-05 NOTE — Anesthesia Procedure Notes (Signed)
Procedure Name: Intubation Date/Time: 06/05/2012 8:39 AM Performed by: Jeani Hawking Pre-anesthesia Checklist: Patient identified, Emergency Drugs available, Suction available, Patient being monitored and Timeout performed Patient Re-evaluated:Patient Re-evaluated prior to inductionOxygen Delivery Method: Circle system utilized Preoxygenation: Pre-oxygenation with 100% oxygen Intubation Type: IV induction Ventilation: Oral airway inserted - appropriate to patient size and Mask ventilation without difficulty Grade View: Grade II Tube type: Oral Number of attempts: 3 Airway Equipment and Method: Video-laryngoscopy Placement Confirmation: ETT inserted through vocal cords under direct vision,  positive ETCO2 and breath sounds checked- equal and bilateral Secured at: 22 cm Tube secured with: Tape Dental Injury: Teeth and Oropharynx as per pre-operative assessment  Difficulty Due To: Difficulty was unanticipated, Difficult Airway- due to immobile epiglottis and Difficult Airway- due to limited oral opening Comments: Easy with glidescope

## 2012-06-06 ENCOUNTER — Encounter (HOSPITAL_COMMUNITY): Payer: Self-pay | Admitting: Neurosurgery

## 2012-06-06 LAB — CBC
HCT: 38.9 % — ABNORMAL LOW (ref 39.0–52.0)
Hemoglobin: 12.6 g/dL — ABNORMAL LOW (ref 13.0–17.0)
MCH: 27.4 pg (ref 26.0–34.0)
MCHC: 32.4 g/dL (ref 30.0–36.0)
MCV: 84.6 fL (ref 78.0–100.0)

## 2012-06-06 LAB — GLUCOSE, CAPILLARY
Glucose-Capillary: 164 mg/dL — ABNORMAL HIGH (ref 70–99)
Glucose-Capillary: 144 mg/dL — ABNORMAL HIGH (ref 70–99)

## 2012-06-06 LAB — BASIC METABOLIC PANEL
BUN: 15 mg/dL (ref 6–23)
Calcium: 9 mg/dL (ref 8.4–10.5)
GFR calc non Af Amer: >90 mL/min (ref >90)
Glucose, Bld: 169 mg/dL — ABNORMAL HIGH (ref 70–99)

## 2012-06-06 MED ORDER — DEXAMETHASONE 4 MG PO TABS: 4.0000 mg | ORAL_TABLET | Freq: Three times a day (TID) | ORAL | Status: DC

## 2012-06-06 MED ORDER — DEXAMETHASONE 2 MG PO TABS
2.0000 mg | ORAL_TABLET | Freq: Two times a day (BID) | ORAL | Status: DC
  Administered 2012-06-06 – 2012-06-07 (×2): 2 mg via ORAL
  Filled 2012-06-06 (×4): qty 1

## 2012-06-06 MED ORDER — SODIUM CHLORIDE 0.9 % IV SOLN
INTRAVENOUS | Status: DC
  Administered 2012-06-06: 10 mL/h via INTRAVENOUS

## 2012-06-06 MED FILL — Bupivacaine HCl Inj 0.25%: INTRAMUSCULAR | Qty: 30 | Status: AC

## 2012-06-06 NOTE — Progress Notes (Signed)
Postop day 1. No headache. No other complaints. Feels good.  Vitals are stable. He is afebrile. Neurologically intact. Wound clean and dry.  Path consistent with meningioma.  Doing well. Mobilized in transfer to floor. Possibly home tomorrow

## 2012-06-07 LAB — GLUCOSE, CAPILLARY: Glucose-Capillary: 149 mg/dL — ABNORMAL HIGH (ref 70–99)

## 2012-06-07 MED ORDER — HYDROCODONE-ACETAMINOPHEN 5-325 MG PO TABS: 1.0000 | ORAL_TABLET | ORAL | Status: AC | PRN

## 2012-06-07 MED ORDER — METHYLPREDNISOLONE 4 MG PO KIT: PACK | ORAL | Status: AC

## 2012-06-07 NOTE — Discharge Summary (Signed)
Physician Discharge Summary  Patient ID: Kerry Vasquez MRN: [id number] DOB/AGE: 1951/08/21 61 y.o.  Admit date: 06/05/2012 Discharge date: 06/07/2012  Admission Diagnoses:  Discharge Diagnoses:  Principal Problem:  *Meningioma   Discharged Condition: good  Hospital Course: Patient admitted to the hospital where he underwent an uncomplicated craniotomy and resection of his right frontal meningioma. Postoperatively he is done well. No headache. No neurologic symptoms. Tolerating regular diet is ambulating without difficulty. Plan is for discharge home.  Consults:   Significant Diagnostic Studies:   Treatments:   Discharge Exam: Blood pressure 154/64, pulse 67, temperature 98.1 F (36.7 C), temperature source Oral, resp. rate 18, height 6\' 2"  (1.88 m), weight 133.8 kg (294 lb 15.6 oz), SpO2 95.00%. Patient awake alert oriented and appropriate. Cranial nerve function is intact. Motor sensory function extremities is normal. Wound clean dry and intact chest and abdomen benign.  Disposition:    Medication List  As of 06/07/2012  8:56 AM   TAKE these medications         amLODipine 10 MG tablet   Commonly known as: NORVASC   Take 10 mg by mouth daily. Patient takes at 3PM      FLAX SEEDS PO   Take 1 capsule by mouth daily. Patient takes at 3PM      HYDROcodone-acetaminophen 5-325 MG per tablet   Commonly known as: NORCO/VICODIN   Take 1 tablet by mouth every 4 (four) hours as needed.      lisinopril 40 MG tablet   Commonly known as: PRINIVIL,ZESTRIL   Take 40 mg by mouth daily. Patient takes at 3 PM      LORazepam 1 MG tablet   Commonly known as: ATIVAN   Take 1 mg by mouth 2 (two) times daily as needed.      methylPREDNISolone 4 MG tablet   Commonly known as: MEDROL DOSEPAK   follow package directions      multivitamin with minerals Tabs   Take 1 tablet by mouth daily. Patient takes at [facility]           Follow-up Information    Follow up with [facility] A, MD.  Call in 1 week.   Contact information:   [address] [place] [address] [phone]          Signed: Temple Pacini 06/07/2012, 8:56 AM

## 2012-06-07 NOTE — Progress Notes (Signed)
Pt refused Pneumonia vaccine. Ilean Skill LPN

## 2012-11-01 ENCOUNTER — Encounter: Payer: Self-pay | Admitting: *Deleted

## 2012-11-12 ENCOUNTER — Encounter: Payer: Self-pay | Admitting: Internal Medicine

## 2013-06-26 ENCOUNTER — Encounter: Payer: Self-pay | Admitting: Internal Medicine

## 2013-12-24 ENCOUNTER — Encounter: Payer: Self-pay | Admitting: Internal Medicine

## 2014-02-14 ENCOUNTER — Ambulatory Visit (AMBULATORY_SURGERY_CENTER): Payer: Self-pay

## 2014-02-14 VITALS — Ht 72.5 in | Wt 300.0 lb

## 2014-02-14 DIAGNOSIS — R195 Other fecal abnormalities: Secondary | ICD-10-CM

## 2014-02-14 MED ORDER — MOVIPREP 100 G PO SOLR: 1.0000 | Freq: Once | ORAL | Status: DC

## 2014-02-14 NOTE — Progress Notes (Signed)
No allergies to eggs or soy. No problems with anesthesia in the past. No oxygen use at home. No diet/weight loss medicine. Emmi instructions given for colonoscopy.  Pt wants anesthesia to be aware of fraility of skull on right side due to tumor removed in the past.

## 2014-02-28 ENCOUNTER — Ambulatory Visit (AMBULATORY_SURGERY_CENTER): Payer: PRIVATE HEALTH INSURANCE | Admitting: Internal Medicine

## 2014-02-28 ENCOUNTER — Encounter: Payer: Self-pay | Admitting: Internal Medicine

## 2014-02-28 VITALS — BP 157/69 | HR 58 | Temp 97.1°F | Resp 13 | Ht 77.0 in | Wt 300.0 lb

## 2014-02-28 DIAGNOSIS — D126 Benign neoplasm of colon, unspecified: Secondary | ICD-10-CM

## 2014-02-28 DIAGNOSIS — R195 Other fecal abnormalities: Secondary | ICD-10-CM

## 2014-02-28 DIAGNOSIS — Z1211 Encounter for screening for malignant neoplasm of colon: Secondary | ICD-10-CM

## 2014-02-28 MED ORDER — SODIUM CHLORIDE 0.9 % IV SOLN: 500.0000 mL | INTRAVENOUS | Status: DC

## 2014-02-28 NOTE — Patient Instructions (Signed)
Discharge instructions given with verbal understanding. Handouts on polyps and hemorrhoids. Resume previous medications. YOU HAD AN ENDOSCOPIC PROCEDURE TODAY AT THE [facility]: Refer to the procedure report that was given to you for any specific questions about what was found during the examination.  If the procedure report does not answer your questions, please call your gastroenterologist to clarify.  If you requested that your care partner not be given the details of your procedure findings, then the procedure report has been included in a sealed envelope for you to review at your convenience later.  YOU SHOULD EXPECT: Some feelings of bloating in the abdomen. Passage of more gas than usual.  Walking can help get rid of the air that was put into your GI tract during the procedure and reduce the bloating. If you had a lower endoscopy (such as a colonoscopy or flexible sigmoidoscopy) you may notice spotting of blood in your stool or on the toilet paper. If you underwent a bowel prep for your procedure, then you may not have a normal bowel movement for a few days.  DIET: Your first meal following the procedure should be a light meal and then it is ok to progress to your normal diet.  A half-sandwich or bowl of soup is an example of a good first meal.  Heavy or fried foods are harder to digest and may make you feel nauseous or bloated.  Likewise meals heavy in dairy and vegetables can cause extra gas to form and this can also increase the bloating.  Drink plenty of fluids but you should avoid alcoholic beverages for 24 hours.  ACTIVITY: Your care partner should take you home directly after the procedure.  You should plan to take it easy, moving slowly for the rest of the day.  You can resume normal activity the day after the procedure however you should NOT DRIVE or use heavy machinery for 24 hours (because of the sedation medicines used during the test).    SYMPTOMS TO REPORT  IMMEDIATELY: A gastroenterologist can be reached at any hour.  During normal business hours, 8:30 AM to 5:00 PM Monday through Friday, call [phone].  After hours and on weekends, please call the GI answering service at [phone] who will take a message and have the physician on call contact you.   Following lower endoscopy (colonoscopy or flexible sigmoidoscopy):  Excessive amounts of blood in the stool  Significant tenderness or worsening of abdominal pains  Swelling of the abdomen that is new, acute  Fever of 100F or higher  FOLLOW UP: If any biopsies were taken you will be contacted by phone or by letter within the next 1-3 weeks.  Call your gastroenterologist if you have not heard about the biopsies in 3 weeks.  Our staff will call the home number listed on your records the next business day following your procedure to check on you and address any questions or concerns that you may have at that time regarding the information given to you following your procedure. This is a courtesy call and so if there is no answer at the home number and we have not heard from you through the emergency physician on call, we will assume that you have returned to your regular daily activities without incident.  SIGNATURES/CONFIDENTIALITY: You and/or your care partner have signed paperwork which will be entered into your electronic medical record.  These signatures attest to the fact that that the information above on your After Visit Summary   has been reviewed and is understood.  Full responsibility of the confidentiality of this discharge information lies with you and/or your care-partner. 

## 2014-02-28 NOTE — Op Note (Addendum)
[name]  [address] [place], [id number]   COLONOSCOPY PROCEDURE REPORT  PATIENT: Kerry Vasquez, Kerry Vasquez  MR#: [id number] BIRTHDATE: 08-29-1951 , 61  yrs. old GENDER: Male ENDOSCOPIST: Lafayette Dragon, MD REFERRED KK:XFGHWE Ehinger, M.D. PROCEDURE DATE:  02/28/2014 PROCEDURE:   Colonoscopy with cold biopsy polypectomy First Screening Colonoscopy - Avg.  risk and is 50 yrs.  old or older - No.  Prior Negative Screening - Now for repeat screening. N/A  History of Adenoma - Now for follow-up colonoscopy & has been > or = to 3 yrs.  N/A  Polyps Removed Today? Yes. ASA CLASS:   Class II INDICATIONS:Average risk patient for colon cancer and the differential stool on home past.  Last colonoscopy January 2007 showed hyperplastic polyp. MEDICATIONS: propofol (Diprivan) 400mg  IV  DESCRIPTION OF PROCEDURE:   After the risks benefits and alternatives of the procedure were thoroughly explained, informed consent was obtained.  A digital rectal exam revealed no abnormalities of the rectum.   The LB [id number]  endoscope was introduced through the anus and advanced to the cecum, which was identified by both the appendix and ileocecal valve. No adverse events experienced.   The quality of the prep was excellent, using MoviPrep  The instrument was then slowly withdrawn as the colon was fully examined.      COLON FINDINGS: Four sessile polyps ranging between 3-31mm in size were found in the ascending colon x3 ( 85,90.120cm) and descending colon.x1 at 45 cm  A polypectomy was performed with cold forceps. The resection was complete and the polyp tissue was completely retrieved.  Retroflexed views revealed no abnormalities. The time to cecum=4 minutes 15 seconds.  Withdrawal time=15 minutes 16 seconds.  The scope was withdrawn and the procedure completed. COMPLICATIONS: There were no complications.  ENDOSCOPIC IMPRESSION: Four sessile polyps ranging between 3-54mm in  size were found in the ascending colonx3  and descending colonx1, polypectomy was performed with cold forceps Small internal hemorrhoids  RECOMMENDATIONS: 1.  Await pathology results 2.  high fiber diet Heme positive stool also likely caused by hemorrhoids or rectal irritation   eSigned:  Lafayette Dragon, MD 07/02/2014 5:00 PM Revised: 07/02/2014 5:00 PM  cc:  Addendum: I have reviewed the indications for this procedure and the correct code is V76.51- colorectal screening. Pt was due for recall colonoscopy . Prior exam in 2007,  PATIENT NAME:  [name], [name] MR#: [id number]

## 2014-02-28 NOTE — Progress Notes (Signed)
Called to room to assist during endoscopic procedure.  Patient ID and intended procedure confirmed with present staff. Received instructions for my participation in the procedure from the performing physician.  

## 2014-02-28 NOTE — Progress Notes (Signed)
Report to pacu rn, vss, bbs=clear 

## 2014-03-03 ENCOUNTER — Telehealth: Payer: Self-pay | Admitting: *Deleted

## 2014-03-03 NOTE — Telephone Encounter (Signed)
  Follow up Call-  Call back number 02/28/2014  Post procedure Call Back phone  # [phone]  Permission to leave phone message Yes     Patient questions:  Message let for patient to call.

## 2014-03-05 ENCOUNTER — Encounter: Payer: Self-pay | Admitting: Internal Medicine

## 2014-03-06 ENCOUNTER — Encounter: Payer: Self-pay | Admitting: *Deleted

## 2015-12-14 ENCOUNTER — Other Ambulatory Visit: Payer: Self-pay | Admitting: Family Medicine

## 2015-12-14 ENCOUNTER — Ambulatory Visit
Admission: RE | Admit: 2015-12-14 | Discharge: 2015-12-14 | Disposition: A | Discharge status: Home / Self Care | Payer: BLUE CROSS/BLUE SHIELD | Source: Ambulatory Visit | Attending: Family Medicine | Admitting: Family Medicine

## 2015-12-14 DIAGNOSIS — R52 Pain, unspecified: Secondary | ICD-10-CM

## 2015-12-25 ENCOUNTER — Other Ambulatory Visit: Payer: Self-pay | Admitting: Family Medicine

## 2015-12-25 DIAGNOSIS — R1011 Right upper quadrant pain: Secondary | ICD-10-CM

## 2016-01-04 ENCOUNTER — Other Ambulatory Visit: Payer: BLUE CROSS/BLUE SHIELD

## 2018-01-15 DIAGNOSIS — C61 Malignant neoplasm of prostate: Secondary | ICD-10-CM | POA: Diagnosis not present

## 2018-01-22 DIAGNOSIS — C61 Malignant neoplasm of prostate: Secondary | ICD-10-CM | POA: Diagnosis not present

## 2018-01-22 DIAGNOSIS — N5201 Erectile dysfunction due to arterial insufficiency: Secondary | ICD-10-CM | POA: Diagnosis not present

## 2018-02-12 DIAGNOSIS — E119 Type 2 diabetes mellitus without complications: Secondary | ICD-10-CM | POA: Diagnosis not present

## 2018-02-12 DIAGNOSIS — Z13220 Encounter for screening for lipoid disorders: Secondary | ICD-10-CM | POA: Diagnosis not present

## 2018-02-12 DIAGNOSIS — Z0000 Encounter for general adult medical examination without abnormal findings: Secondary | ICD-10-CM | POA: Diagnosis not present

## 2018-02-12 DIAGNOSIS — I10 Essential (primary) hypertension: Secondary | ICD-10-CM | POA: Diagnosis not present

## 2018-02-12 DIAGNOSIS — N529 Male erectile dysfunction, unspecified: Secondary | ICD-10-CM | POA: Diagnosis not present

## 2018-03-12 DIAGNOSIS — Z23 Encounter for immunization: Secondary | ICD-10-CM | POA: Diagnosis not present

## 2018-03-12 DIAGNOSIS — I10 Essential (primary) hypertension: Secondary | ICD-10-CM | POA: Diagnosis not present

## 2018-04-09 DIAGNOSIS — I10 Essential (primary) hypertension: Secondary | ICD-10-CM | POA: Diagnosis not present

## 2018-06-18 DIAGNOSIS — H2513 Age-related nuclear cataract, bilateral: Secondary | ICD-10-CM | POA: Diagnosis not present

## 2018-06-18 DIAGNOSIS — E119 Type 2 diabetes mellitus without complications: Secondary | ICD-10-CM | POA: Diagnosis not present

## 2018-06-18 DIAGNOSIS — H538 Other visual disturbances: Secondary | ICD-10-CM | POA: Diagnosis not present

## 2018-08-23 DIAGNOSIS — C61 Malignant neoplasm of prostate: Secondary | ICD-10-CM | POA: Diagnosis not present

## 2018-08-31 ENCOUNTER — Ambulatory Visit (INDEPENDENT_AMBULATORY_CARE_PROVIDER_SITE_OTHER): Payer: Medicare Other | Admitting: Urology

## 2018-08-31 DIAGNOSIS — R9721 Rising PSA following treatment for malignant neoplasm of prostate: Secondary | ICD-10-CM

## 2018-08-31 DIAGNOSIS — C61 Malignant neoplasm of prostate: Secondary | ICD-10-CM | POA: Diagnosis not present

## 2018-08-31 DIAGNOSIS — N5201 Erectile dysfunction due to arterial insufficiency: Secondary | ICD-10-CM

## 2018-10-11 DIAGNOSIS — I10 Essential (primary) hypertension: Secondary | ICD-10-CM | POA: Diagnosis not present

## 2018-10-11 DIAGNOSIS — Z6841 Body Mass Index (BMI) 40.0 and over, adult: Secondary | ICD-10-CM | POA: Diagnosis not present

## 2018-10-11 DIAGNOSIS — E119 Type 2 diabetes mellitus without complications: Secondary | ICD-10-CM | POA: Diagnosis not present

## 2018-10-11 DIAGNOSIS — Z8546 Personal history of malignant neoplasm of prostate: Secondary | ICD-10-CM | POA: Diagnosis not present

## 2018-10-11 DIAGNOSIS — N529 Male erectile dysfunction, unspecified: Secondary | ICD-10-CM | POA: Diagnosis not present

## 2018-12-18 DIAGNOSIS — C61 Malignant neoplasm of prostate: Secondary | ICD-10-CM | POA: Diagnosis not present

## 2018-12-28 ENCOUNTER — Ambulatory Visit (INDEPENDENT_AMBULATORY_CARE_PROVIDER_SITE_OTHER): Payer: Medicare Other | Admitting: Urology

## 2018-12-28 DIAGNOSIS — C61 Malignant neoplasm of prostate: Secondary | ICD-10-CM

## 2018-12-28 DIAGNOSIS — R9721 Rising PSA following treatment for malignant neoplasm of prostate: Secondary | ICD-10-CM | POA: Diagnosis not present

## 2018-12-28 DIAGNOSIS — N393 Stress incontinence (female) (male): Secondary | ICD-10-CM | POA: Diagnosis not present

## 2019-05-20 DIAGNOSIS — E1169 Type 2 diabetes mellitus with other specified complication: Secondary | ICD-10-CM | POA: Diagnosis not present

## 2019-05-20 DIAGNOSIS — I10 Essential (primary) hypertension: Secondary | ICD-10-CM | POA: Diagnosis not present

## 2019-05-20 DIAGNOSIS — Z0000 Encounter for general adult medical examination without abnormal findings: Secondary | ICD-10-CM | POA: Diagnosis not present

## 2019-05-20 DIAGNOSIS — Z8546 Personal history of malignant neoplasm of prostate: Secondary | ICD-10-CM | POA: Diagnosis not present

## 2019-05-20 DIAGNOSIS — N529 Male erectile dysfunction, unspecified: Secondary | ICD-10-CM | POA: Diagnosis not present

## 2019-05-21 DIAGNOSIS — I10 Essential (primary) hypertension: Secondary | ICD-10-CM | POA: Diagnosis not present

## 2019-05-21 DIAGNOSIS — E1169 Type 2 diabetes mellitus with other specified complication: Secondary | ICD-10-CM | POA: Diagnosis not present

## 2019-05-21 DIAGNOSIS — Z8546 Personal history of malignant neoplasm of prostate: Secondary | ICD-10-CM | POA: Diagnosis not present

## 2019-06-20 DIAGNOSIS — H524 Presbyopia: Secondary | ICD-10-CM | POA: Diagnosis not present

## 2019-06-20 DIAGNOSIS — H538 Other visual disturbances: Secondary | ICD-10-CM | POA: Diagnosis not present

## 2019-06-20 DIAGNOSIS — H2513 Age-related nuclear cataract, bilateral: Secondary | ICD-10-CM | POA: Diagnosis not present

## 2019-06-20 DIAGNOSIS — E119 Type 2 diabetes mellitus without complications: Secondary | ICD-10-CM | POA: Diagnosis not present

## 2019-11-20 DIAGNOSIS — Z8546 Personal history of malignant neoplasm of prostate: Secondary | ICD-10-CM | POA: Diagnosis not present

## 2019-11-20 DIAGNOSIS — I10 Essential (primary) hypertension: Secondary | ICD-10-CM | POA: Diagnosis not present

## 2019-11-20 DIAGNOSIS — E1169 Type 2 diabetes mellitus with other specified complication: Secondary | ICD-10-CM | POA: Diagnosis not present

## 2019-11-21 DIAGNOSIS — I10 Essential (primary) hypertension: Secondary | ICD-10-CM | POA: Diagnosis not present

## 2019-11-21 DIAGNOSIS — Z8546 Personal history of malignant neoplasm of prostate: Secondary | ICD-10-CM | POA: Diagnosis not present

## 2019-11-21 DIAGNOSIS — E1169 Type 2 diabetes mellitus with other specified complication: Secondary | ICD-10-CM | POA: Diagnosis not present

## 2019-11-22 ENCOUNTER — Other Ambulatory Visit: Payer: Self-pay

## 2019-12-23 ENCOUNTER — Ambulatory Visit: Payer: Medicare Other | Attending: Internal Medicine

## 2019-12-23 DIAGNOSIS — Z23 Encounter for immunization: Secondary | ICD-10-CM | POA: Insufficient documentation

## 2019-12-23 NOTE — Progress Notes (Signed)
   Covid-19 Vaccination Clinic  Name:  Kerry Vasquez    MRN: [id number] DOB: 08-07-1951  12/23/2019  Mr. [name] was observed post Covid-19 immunization for 15 minutes without incidence. He was provided with Vaccine Information Sheet and instruction to access the V-Safe system.   Mr. [name] was instructed to call 911 with any severe reactions post vaccine: Marland Kitchen Difficulty breathing  . Swelling of your face and throat  . A fast heartbeat  . A bad rash all over your body  . Dizziness and weakness    Immunizations Administered    Name Date Dose VIS Date Route   Pfizer COVID-19 Vaccine 12/23/2019  9:46 AM 0.3 mL 10/11/2019 Intramuscular   Manufacturer: [place]   Lot: [id number]   [place]: [id number]

## 2020-01-15 ENCOUNTER — Ambulatory Visit: Payer: Medicare Other | Attending: Internal Medicine

## 2020-01-15 DIAGNOSIS — Z23 Encounter for immunization: Secondary | ICD-10-CM

## 2020-01-15 NOTE — Progress Notes (Signed)
   Covid-19 Vaccination Clinic  Name:  Kerry Vasquez    MRN: [id number] DOB: 1951/03/29  01/15/2020  Mr. Holter was observed post Covid-19 immunization for 15 minutes without incident. He was provided with Vaccine Information Sheet and instruction to access the V-Safe system.   Mr. [name] was instructed to call 911 with any severe reactions post vaccine: Marland Kitchen Difficulty breathing  . Swelling of face and throat  . A fast heartbeat  . A bad rash all over body  . Dizziness and weakness   Immunizations Administered    Name Date Dose VIS Date Route   Pfizer COVID-19 Vaccine 01/15/2020  9:27 AM 0.3 mL 10/11/2019 Intramuscular   Manufacturer: [place]   Lot: [id number]   [place]: [id number]

## 2020-06-03 DIAGNOSIS — I10 Essential (primary) hypertension: Secondary | ICD-10-CM | POA: Diagnosis not present

## 2020-06-03 DIAGNOSIS — Z8546 Personal history of malignant neoplasm of prostate: Secondary | ICD-10-CM | POA: Diagnosis not present

## 2020-06-03 DIAGNOSIS — R198 Other specified symptoms and signs involving the digestive system and abdomen: Secondary | ICD-10-CM | POA: Diagnosis not present

## 2020-06-03 DIAGNOSIS — E1169 Type 2 diabetes mellitus with other specified complication: Secondary | ICD-10-CM | POA: Diagnosis not present

## 2020-06-03 DIAGNOSIS — Z0000 Encounter for general adult medical examination without abnormal findings: Secondary | ICD-10-CM | POA: Diagnosis not present

## 2020-06-03 DIAGNOSIS — Z1159 Encounter for screening for other viral diseases: Secondary | ICD-10-CM | POA: Diagnosis not present

## 2020-06-03 DIAGNOSIS — Z1389 Encounter for screening for other disorder: Secondary | ICD-10-CM | POA: Diagnosis not present

## 2020-06-09 ENCOUNTER — Other Ambulatory Visit: Payer: Self-pay

## 2020-06-19 DIAGNOSIS — E119 Type 2 diabetes mellitus without complications: Secondary | ICD-10-CM | POA: Diagnosis not present

## 2020-06-19 DIAGNOSIS — H538 Other visual disturbances: Secondary | ICD-10-CM | POA: Diagnosis not present

## 2020-06-19 DIAGNOSIS — H2513 Age-related nuclear cataract, bilateral: Secondary | ICD-10-CM | POA: Diagnosis not present

## 2020-06-19 DIAGNOSIS — H524 Presbyopia: Secondary | ICD-10-CM | POA: Diagnosis not present

## 2020-10-22 ENCOUNTER — Ambulatory Visit: Payer: Medicare Other | Attending: Internal Medicine

## 2020-10-22 DIAGNOSIS — Z23 Encounter for immunization: Secondary | ICD-10-CM

## 2020-10-22 NOTE — Progress Notes (Signed)
   Covid-19 Vaccination Clinic  Name:  MILOH ALCOCER    MRN: [id number] DOB: 1950-11-08  10/22/2020  [name] was observed post Covid-19 immunization for 15 minutes without incident. He was provided with Vaccine Information Sheet and instruction to access the V-Safe system.   [name] was instructed to call 911 with any severe reactions post vaccine: Marland Kitchen Difficulty breathing  . Swelling of face and throat  . A fast heartbeat  . A bad rash all over body  . Dizziness and weakness   Immunizations Administered    Name Date Dose VIS Date Route   Pfizer COVID-19 Vaccine 10/22/2020  1:28 PM 0.3 mL 08/19/2020 Intramuscular   Manufacturer: [place]   Lot: [id number]   [place]: [id number]

## 2020-12-03 DIAGNOSIS — Z8546 Personal history of malignant neoplasm of prostate: Secondary | ICD-10-CM | POA: Diagnosis not present

## 2020-12-03 DIAGNOSIS — E1169 Type 2 diabetes mellitus with other specified complication: Secondary | ICD-10-CM | POA: Diagnosis not present

## 2020-12-03 DIAGNOSIS — I10 Essential (primary) hypertension: Secondary | ICD-10-CM | POA: Diagnosis not present

## 2021-02-25 ENCOUNTER — Ambulatory Visit (INDEPENDENT_AMBULATORY_CARE_PROVIDER_SITE_OTHER): Payer: Medicare Other | Admitting: Urology

## 2021-02-25 ENCOUNTER — Encounter: Payer: Self-pay | Admitting: Urology

## 2021-02-25 ENCOUNTER — Other Ambulatory Visit: Payer: Self-pay

## 2021-02-25 VITALS — BP 162/76 | HR 65 | Temp 98.7°F | Ht 73.0 in | Wt 296.0 lb

## 2021-02-25 DIAGNOSIS — R9721 Rising PSA following treatment for malignant neoplasm of prostate: Secondary | ICD-10-CM | POA: Diagnosis not present

## 2021-02-25 DIAGNOSIS — C61 Malignant neoplasm of prostate: Secondary | ICD-10-CM

## 2021-02-25 LAB — URINALYSIS, ROUTINE W REFLEX MICROSCOPIC
Bilirubin, UA: NEGATIVE
Glucose, UA: NEGATIVE
Ketones, UA: NEGATIVE
Leukocytes,UA: NEGATIVE
Nitrite, UA: NEGATIVE
Protein,UA: NEGATIVE
RBC, UA: NEGATIVE
Specific Gravity, UA: <1.005 — ABNORMAL LOW (ref 1.005–1.030)
Urobilinogen, Ur: 0.2 mg/dL (ref 0.2–1.0)
pH, UA: 6.5 (ref 5.0–7.5)

## 2021-02-25 NOTE — Progress Notes (Signed)
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
## 2021-06-16 DIAGNOSIS — R5383 Other fatigue: Secondary | ICD-10-CM | POA: Diagnosis not present

## 2021-06-16 DIAGNOSIS — I10 Essential (primary) hypertension: Secondary | ICD-10-CM | POA: Diagnosis not present

## 2021-06-16 DIAGNOSIS — R0681 Apnea, not elsewhere classified: Secondary | ICD-10-CM | POA: Diagnosis not present

## 2021-06-16 DIAGNOSIS — Z0000 Encounter for general adult medical examination without abnormal findings: Secondary | ICD-10-CM | POA: Diagnosis not present

## 2021-06-16 DIAGNOSIS — E1169 Type 2 diabetes mellitus with other specified complication: Secondary | ICD-10-CM | POA: Diagnosis not present

## 2021-06-16 DIAGNOSIS — Z1389 Encounter for screening for other disorder: Secondary | ICD-10-CM | POA: Diagnosis not present

## 2021-06-16 DIAGNOSIS — Z8546 Personal history of malignant neoplasm of prostate: Secondary | ICD-10-CM | POA: Diagnosis not present

## 2021-06-17 ENCOUNTER — Other Ambulatory Visit: Payer: Self-pay

## 2021-06-21 DIAGNOSIS — E119 Type 2 diabetes mellitus without complications: Secondary | ICD-10-CM | POA: Diagnosis not present

## 2021-06-21 DIAGNOSIS — H538 Other visual disturbances: Secondary | ICD-10-CM | POA: Diagnosis not present

## 2021-06-21 DIAGNOSIS — H04129 Dry eye syndrome of unspecified lacrimal gland: Secondary | ICD-10-CM | POA: Diagnosis not present

## 2021-06-21 DIAGNOSIS — H524 Presbyopia: Secondary | ICD-10-CM | POA: Diagnosis not present

## 2021-06-25 DIAGNOSIS — G4719 Other hypersomnia: Secondary | ICD-10-CM | POA: Diagnosis not present

## 2021-06-25 DIAGNOSIS — I10 Essential (primary) hypertension: Secondary | ICD-10-CM | POA: Diagnosis not present

## 2021-07-15 DIAGNOSIS — G4733 Obstructive sleep apnea (adult) (pediatric): Secondary | ICD-10-CM | POA: Diagnosis not present

## 2021-07-15 DIAGNOSIS — I10 Essential (primary) hypertension: Secondary | ICD-10-CM | POA: Diagnosis not present

## 2021-08-30 DIAGNOSIS — Z23 Encounter for immunization: Secondary | ICD-10-CM | POA: Diagnosis not present

## 2021-09-02 ENCOUNTER — Ambulatory Visit (INDEPENDENT_AMBULATORY_CARE_PROVIDER_SITE_OTHER): Payer: Medicare Other | Admitting: Urology

## 2021-09-02 ENCOUNTER — Other Ambulatory Visit: Payer: Self-pay

## 2021-09-02 ENCOUNTER — Other Ambulatory Visit: Payer: Self-pay | Admitting: Urology

## 2021-09-02 VITALS — BP 150/70 | HR 72 | Temp 98.6°F

## 2021-09-02 DIAGNOSIS — C61 Malignant neoplasm of prostate: Secondary | ICD-10-CM

## 2021-09-02 DIAGNOSIS — R9721 Rising PSA following treatment for malignant neoplasm of prostate: Secondary | ICD-10-CM

## 2021-09-02 LAB — URINALYSIS, ROUTINE W REFLEX MICROSCOPIC
Bilirubin, UA: NEGATIVE
Glucose, UA: NEGATIVE
Ketones, UA: NEGATIVE
Leukocytes,UA: NEGATIVE
Nitrite, UA: NEGATIVE
Protein,UA: NEGATIVE
RBC, UA: NEGATIVE
Specific Gravity, UA: 1.015 (ref 1.005–1.030)
Urobilinogen, Ur: 1 mg/dL (ref 0.2–1.0)
pH, UA: 7 (ref 5.0–7.5)

## 2021-09-02 NOTE — Progress Notes (Signed)
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
## 2021-09-03 LAB — PSA: Prostate Specific Ag, Serum: 0.3 ng/mL (ref 0.0–4.0)

## 2021-10-07 DIAGNOSIS — U071 COVID-19: Secondary | ICD-10-CM | POA: Diagnosis not present

## 2021-10-18 DIAGNOSIS — G4733 Obstructive sleep apnea (adult) (pediatric): Secondary | ICD-10-CM | POA: Diagnosis not present

## 2021-11-24 ENCOUNTER — Emergency Department (HOSPITAL_BASED_OUTPATIENT_CLINIC_OR_DEPARTMENT_OTHER): Payer: Medicare Other

## 2021-11-24 ENCOUNTER — Emergency Department (HOSPITAL_COMMUNITY): Payer: Medicare Other

## 2021-11-24 ENCOUNTER — Emergency Department (HOSPITAL_COMMUNITY)
Admission: EM | Admit: 2021-11-24 | Discharge: 2021-11-24 | Disposition: A | Discharge status: Home / Self Care | Payer: Medicare Other | Attending: Emergency Medicine | Admitting: Emergency Medicine

## 2021-11-24 ENCOUNTER — Other Ambulatory Visit: Payer: Self-pay

## 2021-11-24 ENCOUNTER — Encounter (HOSPITAL_COMMUNITY): Payer: Self-pay

## 2021-11-24 DIAGNOSIS — Z7982 Long term (current) use of aspirin: Secondary | ICD-10-CM | POA: Diagnosis not present

## 2021-11-24 DIAGNOSIS — R471 Dysarthria and anarthria: Secondary | ICD-10-CM | POA: Insufficient documentation

## 2021-11-24 DIAGNOSIS — E66813 Obesity, class 3: Secondary | ICD-10-CM | POA: Diagnosis present

## 2021-11-24 DIAGNOSIS — R29818 Other symptoms and signs involving the nervous system: Secondary | ICD-10-CM | POA: Diagnosis not present

## 2021-11-24 DIAGNOSIS — Z8546 Personal history of malignant neoplasm of prostate: Secondary | ICD-10-CM | POA: Insufficient documentation

## 2021-11-24 DIAGNOSIS — G459 Transient cerebral ischemic attack, unspecified: Secondary | ICD-10-CM

## 2021-11-24 DIAGNOSIS — R4781 Slurred speech: Secondary | ICD-10-CM

## 2021-11-24 DIAGNOSIS — E119 Type 2 diabetes mellitus without complications: Secondary | ICD-10-CM | POA: Diagnosis not present

## 2021-11-24 DIAGNOSIS — R479 Unspecified speech disturbances: Secondary | ICD-10-CM | POA: Diagnosis not present

## 2021-11-24 DIAGNOSIS — I10 Essential (primary) hypertension: Secondary | ICD-10-CM | POA: Insufficient documentation

## 2021-11-24 DIAGNOSIS — Z20822 Contact with and (suspected) exposure to covid-19: Secondary | ICD-10-CM | POA: Diagnosis not present

## 2021-11-24 DIAGNOSIS — R791 Abnormal coagulation profile: Secondary | ICD-10-CM | POA: Insufficient documentation

## 2021-11-24 DIAGNOSIS — I672 Cerebral atherosclerosis: Secondary | ICD-10-CM | POA: Diagnosis not present

## 2021-11-24 DIAGNOSIS — R29810 Facial weakness: Secondary | ICD-10-CM | POA: Diagnosis not present

## 2021-11-24 DIAGNOSIS — R270 Ataxia, unspecified: Secondary | ICD-10-CM

## 2021-11-24 DIAGNOSIS — I6502 Occlusion and stenosis of left vertebral artery: Secondary | ICD-10-CM | POA: Diagnosis not present

## 2021-11-24 DIAGNOSIS — R531 Weakness: Secondary | ICD-10-CM | POA: Diagnosis not present

## 2021-11-24 DIAGNOSIS — Z79899 Other long term (current) drug therapy: Secondary | ICD-10-CM | POA: Insufficient documentation

## 2021-11-24 DIAGNOSIS — R2689 Other abnormalities of gait and mobility: Secondary | ICD-10-CM | POA: Diagnosis not present

## 2021-11-24 DIAGNOSIS — I6523 Occlusion and stenosis of bilateral carotid arteries: Secondary | ICD-10-CM | POA: Diagnosis not present

## 2021-11-24 LAB — HEMOGLOBIN A1C
Hgb A1c MFr Bld: 7.1 % — ABNORMAL HIGH (ref 4.8–5.6)
Mean Plasma Glucose: 157 mg/dL

## 2021-11-24 LAB — DIFFERENTIAL
Abs Immature Granulocytes: 0.02 10*3/uL (ref 0.00–0.07)
Basophils Absolute: 0.1 10*3/uL (ref 0.0–0.1)
Basophils Relative: 1 %
Eosinophils Absolute: 0.3 10*3/uL (ref 0.0–0.5)
Eosinophils Relative: 4 %
Immature Granulocytes: 0 %
Lymphocytes Relative: 37 %
Lymphs Abs: 2.6 10*3/uL (ref 0.7–4.0)
Monocytes Absolute: 0.7 10*3/uL (ref 0.1–1.0)
Monocytes Relative: 10 %
Neutro Abs: 3.4 10*3/uL (ref 1.7–7.7)
Neutrophils Relative %: 48 %

## 2021-11-24 LAB — LIPID PANEL
Cholesterol: 133 mg/dL (ref 0–200)
HDL: 50 mg/dL (ref >40)
LDL Cholesterol: 70 mg/dL (ref 0–99)
Total CHOL/HDL Ratio: 2.7 RATIO
Triglycerides: 67 mg/dL (ref <150)
VLDL: 13 mg/dL (ref 0–40)

## 2021-11-24 LAB — APTT: aPTT: 28 seconds (ref 24–36)

## 2021-11-24 LAB — URINALYSIS, ROUTINE W REFLEX MICROSCOPIC
Bilirubin Urine: NEGATIVE
Glucose, UA: NEGATIVE mg/dL
Hgb urine dipstick: NEGATIVE
Ketones, ur: NEGATIVE mg/dL
Leukocytes,Ua: NEGATIVE
Nitrite: NEGATIVE
Protein, ur: NEGATIVE mg/dL
Specific Gravity, Urine: 1.004 — ABNORMAL LOW (ref 1.005–1.030)
pH: 6 (ref 5.0–8.0)

## 2021-11-24 LAB — ECHOCARDIOGRAM COMPLETE
AR max vel: 2.79 cm2
AV Area VTI: 2.44 cm2
AV Area mean vel: 2.17 cm2
AV Mean grad: 7 mmHg
AV Peak grad: 13 mmHg
Ao pk vel: 1.8 m/s
Area-P 1/2: 2.78 cm2
Calc EF: 58.5 %
Height: 70 in
MV VTI: 2.69 cm2
S' Lateral: 3.1 cm
Single Plane A2C EF: 58 %
Single Plane A4C EF: 59.8 %
Weight: 5010 oz

## 2021-11-24 LAB — COMPREHENSIVE METABOLIC PANEL
ALT: 19 U/L (ref 0–44)
AST: 16 U/L (ref 15–41)
Albumin: 4.3 g/dL (ref 3.5–5.0)
Alkaline Phosphatase: 92 U/L (ref 38–126)
Anion gap: 10 (ref 5–15)
BUN: 18 mg/dL (ref 8–23)
CO2: 28 mmol/L (ref 22–32)
Calcium: 8.9 mg/dL (ref 8.9–10.3)
Chloride: 100 mmol/L (ref 98–111)
Creatinine, Ser: 0.83 mg/dL (ref 0.61–1.24)
GFR, Estimated: >60 mL/min (ref >60)
Glucose, Bld: 153 mg/dL — ABNORMAL HIGH (ref 70–99)
Potassium: 3.5 mmol/L (ref 3.5–5.1)
Sodium: 138 mmol/L (ref 135–145)
Total Bilirubin: 0.5 mg/dL (ref 0.3–1.2)
Total Protein: 8 g/dL (ref 6.5–8.1)

## 2021-11-24 LAB — RAPID URINE DRUG SCREEN, HOSP PERFORMED
Amphetamines: NOT DETECTED
Barbiturates: NOT DETECTED
Benzodiazepines: NOT DETECTED
Cocaine: NOT DETECTED
Opiates: NOT DETECTED
Tetrahydrocannabinol: NOT DETECTED

## 2021-11-24 LAB — CBC
HCT: 43.6 % (ref 39.0–52.0)
Hemoglobin: 13.7 g/dL (ref 13.0–17.0)
MCH: 27.5 pg (ref 26.0–34.0)
MCHC: 31.4 g/dL (ref 30.0–36.0)
MCV: 87.4 fL (ref 80.0–100.0)
Platelets: 263 10*3/uL (ref 150–400)
RBC: 4.99 MIL/uL (ref 4.22–5.81)
RDW: 14.3 % (ref 11.5–15.5)
WBC: 7.1 10*3/uL (ref 4.0–10.5)
nRBC: 0 % (ref 0.0–0.2)

## 2021-11-24 LAB — ETHANOL: Alcohol, Ethyl (B): <10 mg/dL (ref <10)

## 2021-11-24 LAB — RESP PANEL BY RT-PCR (FLU A&B, COVID) ARPGX2
Influenza A by PCR: NEGATIVE
Influenza B by PCR: NEGATIVE
SARS Coronavirus 2 by RT PCR: NEGATIVE

## 2021-11-24 LAB — PROTIME-INR
INR: 1 (ref 0.8–1.2)
Prothrombin Time: 12.7 seconds (ref 11.4–15.2)

## 2021-11-24 LAB — CBG MONITORING, ED: Glucose-Capillary: 153 mg/dL — ABNORMAL HIGH (ref 70–99)

## 2021-11-24 LAB — TSH: TSH: 4.958 u[IU]/mL — ABNORMAL HIGH (ref 0.350–4.500)

## 2021-11-24 MED ORDER — ROSUVASTATIN CALCIUM 5 MG PO TABS: 5.0000 mg | ORAL_TABLET | Freq: Every evening | ORAL | 11 refills | Status: AC

## 2021-11-24 MED ORDER — AMLODIPINE BESYLATE 10 MG PO TABS: 10.0000 mg | ORAL_TABLET | Freq: Every day | ORAL | 11 refills | Status: AC

## 2021-11-24 MED ORDER — IRBESARTAN 300 MG PO TABS: 300.0000 mg | ORAL_TABLET | Freq: Every day | ORAL | 11 refills | Status: AC

## 2021-11-24 MED ORDER — HYDROCHLOROTHIAZIDE 25 MG PO TABS: 12.5000 mg | ORAL_TABLET | Freq: Every day | ORAL | 0 refills | Status: DC

## 2021-11-24 MED ORDER — ASPIRIN 81 MG PO CHEW
324.0000 mg | CHEWABLE_TABLET | Freq: Once | ORAL | Status: AC
  Administered 2021-11-24: 07:00:00 324 mg via ORAL
  Filled 2021-11-24: qty 4

## 2021-11-24 MED ORDER — METOPROLOL SUCCINATE ER 50 MG PO TB24: 50.0000 mg | ORAL_TABLET | Freq: Every day | ORAL | 11 refills | Status: AC

## 2021-11-24 MED ORDER — IOHEXOL 350 MG/ML SOLN
100.0000 mL | Freq: Once | INTRAVENOUS | Status: AC | PRN
  Administered 2021-11-24: 10:00:00 75 mL via INTRAVENOUS

## 2021-11-24 MED ORDER — ASPIRIN 81 MG PO TABS: 81.0000 mg | ORAL_TABLET | Freq: Every day | ORAL | 11 refills | Status: DC

## 2021-11-24 MED ORDER — CLOPIDOGREL BISULFATE 75 MG PO TABS: 75.0000 mg | ORAL_TABLET | Freq: Every day | ORAL | 0 refills | Status: AC

## 2021-11-24 NOTE — ED Notes (Signed)
Echo at bedside

## 2021-11-24 NOTE — ED Triage Notes (Signed)
Pt woke up this am, was unsteady on his feet, saying something isnot right.  Last seen normal at 11 pm

## 2021-11-24 NOTE — Progress Notes (Signed)
CODE STROKE  CALL TIME   624 AM BEEPER TIME  624 EXAM START   629 EXAM END   [name]

## 2021-11-24 NOTE — Progress Notes (Signed)
*  PRELIMINARY RESULTS* Echocardiogram 2D Echocardiogram has been performed.  Kerry Vasquez 11/24/2021, 9:45 AM

## 2021-11-24 NOTE — ED Notes (Signed)
Exline paged out @ [phone].

## 2021-11-24 NOTE — Consult Note (Signed)
[facility] TeleNeurology Consult Services   Patient Name:   Kerry Vasquez, Kerry Vasquez Date of Birth:   Sep 01, 1951 Identification Number:   MRN - [id number] Date of Service:   11/24/2021 06:35:59  Diagnosis:       I63.9 - Cerebrovascular accident (CVA), unspecified mechanism ([place])  Impression:      very subtle left facial droop (unclear if this is from his prior Bell's palsy), slurred speech, unsteady gait, and ringing in his ears- concerning for right hemispheric versus posterior circulation stroke.  Metrics: Last Known Well: 11/24/2021 00:00:00 [facility] Notification Time: 11/24/2021 06:35:58 Arrival Time: 11/24/2021 06:13:00 Stamp Time: 11/24/2021 06:35:59 Initial Response Time: 11/24/2021 06:43:05 Symptoms: slurred speech and unsteady gait. NIHSS Start Assessment Time: 11/24/2021 06:59:21 Patient is not a candidate for Thrombolytic. Thrombolytic Medical Decision: 11/24/2021 06:56:07 Patient was not deemed candidate for Thrombolytic because of following reasons: Last Well Known Above 4.5 Hours.  CT head showed no acute hemorrhage or acute core infarct.  ED Physician notified of diagnostic impression and management plan on 11/24/2021 07:12:17  Advanced Imaging: Advanced Imaging Not Completed because:  NIHSS < 6 with no cortical findings and history/exam not consistent with LVO   Our recommendations are outlined below.  Recommendations:        Stroke/Telemetry Floor       Neuro Checks       Bedside Swallow Eval       DVT Prophylaxis       IV Fluids, Normal Saline       Head of Bed 30 Degrees       Euglycemia and Avoid Hyperthermia (PRN Acetaminophen)       Initiate or continue Aspirin 325 MG daily       Antihypertensives PRN if Blood pressure is greater than 220/120 or there is a concern for End organ damage/contraindications for permissive HTN. If blood pressure is greater than 220/120 give labetalol PO or IV or Vasotec IV with a goal of 15%  reduction in BP during the first 24 hours.  Routine Consultation with [facility] for Follow up Care  Sign Out:       Discussed with Emergency Department Provider    ------------------------------------------------------------------------------  History of Present Illness: Patient is a 71 year old Male.  Patient was brought by private transportation with symptoms of slurred speech and unsteady gait. A 71 year old man with a history of HTN, DM, prostate cancer, meningioma s/p resection (he has screws in his head and is unsure if they are MRI compatible), Bells' palsy (unknown side), and OSA who presents with slurred speech and unsteady gait. He was last normal when he went to bed last night at midnight. He slept through the night. When he woke at 0600, he immediately noticed that his ears were ringing and he was not feeling well. He got up and went to the bathroom. He felt weak while walking and felt funny but had difficulty describing what he means by "funny." He then spoke to his wife to tell her he wasn't feeling well, and she immediately noticed that he had slurred speech.    Past Medical History:      Hypertension      Diabetes Mellitus  Medications:  No Anticoagulant use  Antiplatelet use: Yes ASA 81 mg Reviewed EMR for current medications  Allergies:  Reviewed  Social History: Smoking: No  Family History:  There is no family history of premature cerebrovascular disease pertinent to this consultation  ROS : 14 Points Review of Systems was performed and was  negative except mentioned in HPI.  Past Surgical History: There Is No Surgical History Contributory To Todays Visit     Examination: BP(182/78), Pulse(91), Blood Glucose(153) 1A: Level of Consciousness - Alert; keenly responsive + 0 1B: Ask Month and Age - Both Questions Right + 0 1C: Blink Eyes & Squeeze Hands - Performs Both Tasks + 0 2: Test Horizontal Extraocular Movements - Normal + 0 3: Test  Visual Fields - No Visual Loss + 0 4: Test Facial Palsy (Use [place] if Obtunded) - Minor paralysis (flat nasolabial fold, smile asymmetry) + 1 5A: Test Left Arm Motor Drift - No Drift for 10 Seconds + 0 5B: Test Right Arm Motor Drift - No Drift for 10 Seconds + 0 6A: Test Left Leg Motor Drift - No Drift for 5 Seconds + 0 6B: Test Right Leg Motor Drift - No Drift for 5 Seconds + 0 7: Test Limb Ataxia (FNF/Heel-Shin) - No Ataxia + 0 8: Test Sensation - Normal; No sensory loss + 0 9: Test Language/Aphasia - Normal; No aphasia + 0 10: Test Dysarthria - Mild-Moderate Dysarthria: Slurring but can be understood + 1 11: Test Extinction/Inattention - No abnormality + 0  NIHSS Score: 2   Pre-Morbid Modified Rankin Scale: 1 Points = No significant disability despite symptoms; able to carry out all usual duties and activities   Patient/Family was informed the Neurology Consult would occur via TeleHealth consult by way of interactive audio and video telecommunications and consented to receiving care in this manner.   Patient is being evaluated for possible acute neurologic impairment and high probability of imminent or life-threatening deterioration. I spent total of 25 minutes providing care to this patient, including time for face to face visit via telemedicine, review of medical records, imaging studies and discussion of findings with providers, the patient and/or family.   Dr Uvaldo Bristle   [facility] [phone]   Case [id number]

## 2021-11-24 NOTE — ED Notes (Signed)
Patient transported to CT 

## 2021-11-24 NOTE — Evaluation (Signed)
Occupational Therapy Evaluation Patient Details Name: Kerry Vasquez MRN: [id number] DOB: May 07, 1951 Today's Date: 11/24/2021   History of Present Illness 71 year old male with a history of hypertension, prostate cancer, diabetes who presents with difficulty speaking and gait disturbance.  Wife states that he woke up this morning and alerted her that he felt weird.  She noted that he was having difficulty speaking and walking.  I was called back emergently to the room.  Last seen normal at 11 PM before going to bed.  He has some dysarthria and a slight left-sided facial droop.  He also has some slight weakness on the left greater than right.  Wife states that this is new.  Denies alcohol or drug use.   Clinical Impression   Pt agreeable to OT and PT co-evaluation. Pt demonstrates general weakness in B UE and is able to transfer and ambulate in room and hall with supervision to mod I level of assist without AD. Pt is mildly unsteady in standing without AD. Primary deficit area appears to be balance at this time. Pt is not recommended for further acute OT services and will be discharged to care of nursing staff for remaining length of stay.      Recommendations for follow up therapy are one component of a multi-disciplinary discharge planning process, led by the attending physician.  Recommendations may be updated based on patient status, additional functional criteria and insurance authorization.   Follow Up Recommendations  No OT follow up    Assistance Recommended at Discharge PRN (for mobility)  Patient can return home with the following A little help with walking and/or transfers    Functional Status Assessment  Patient has had a recent decline in their functional status and demonstrates the ability to make significant improvements in function in a reasonable and predictable amount of time.  Equipment Recommendations  None recommended by OT    Recommendations for Other Services        Precautions / Restrictions Precautions Precautions: None Restrictions Weight Bearing Restrictions: No      Mobility Bed Mobility Overal bed mobility: Modified Independent                  Transfers Overall transfer level: Needs assistance   Transfers: Sit to/from Stand, Bed to chair/wheelchair/BSC Sit to Stand: Modified independent (Device/Increase time)     Step pivot transfers: Modified independent (Device/Increase time), Supervision     General transfer comment: Mild unsteadiness in standing without AD.      Balance Overall balance assessment: Mild deficits observed, not formally tested                                         ADL either performed or assessed with clinical judgement   ADL Overall ADL's : Needs assistance/impaired                         Toilet Transfer: Modified Independent;Supervision/safety;Ambulation Toilet Transfer Details (indicate cue type and reason): simulated via EOB to ambulation in hall and back to EOB         Functional mobility during ADLs: Modified independent;Supervision/safety General ADL Comments: Mild labored movement for donnning socks seated at EOB and for functional transfers.     Vision Baseline Vision/History: 1 Wears glasses Ability to See in Adequate Light: 1 Impaired Patient Visual Report: No change from baseline Vision Assessment?:  No apparent visual deficits                Pertinent Vitals/Pain Pain Assessment Pain Assessment: Faces Pain Score: 0-No pain     Hand Dominance Right   Extremity/Trunk Assessment Upper Extremity Assessment Upper Extremity Assessment: Generalized weakness   Lower Extremity Assessment Lower Extremity Assessment: Defer to PT evaluation   Cervical / Trunk Assessment Cervical / Trunk Assessment: Normal   Communication Communication Communication: No difficulties   Cognition Arousal/Alertness: Awake/alert Behavior During Therapy:  WFL for tasks assessed/performed Overall Cognitive Status: Within Functional Limits for tasks assessed                                                        Home Living Family/patient expects to be discharged to:: Private residence Living Arrangements: Spouse/significant other Available Help at Discharge: Family;Available 24 hours/day Type of Home: House Home Access: Stairs to enter [employer] of Steps: 2 Entrance Stairs-Rails: Left (R has a door handle.) Home Layout: One level     Bathroom Shower/Tub: Teacher, early years/pre: Standard Bathroom Accessibility: Yes How Accessible: Accessible via walker Home Equipment: Rolling Walker (2 wheels);BSC/3in1          Prior Functioning/Environment Prior Level of Function : Independent/Modified Independent             Mobility Comments: Independent household and community ambulator. ADLs Comments: Pt reports independence for ADL's and IADL's.                      OT Goals(Current goals can be found in the care plan section) Acute Rehab OT Goals Patient Stated Goal: return home  OT Frequency:      Co-evaluation PT/OT/SLP Co-Evaluation/Treatment: Yes Reason for Co-Treatment: To address functional/ADL transfers   OT goals addressed during session: ADL's and self-care      AM-PAC OT "6 Clicks" Daily Activity     Outcome Measure Help from another person eating meals?: None Help from another person taking care of personal grooming?: None Help from another person toileting, which includes using toliet, bedpan, or urinal?: None Help from another person bathing (including washing, rinsing, drying)?: None Help from another person to put on and taking off regular upper body clothing?: None Help from another person to put on and taking off regular lower body clothing?: None 6 Click Score: 24   End of Session    Activity Tolerance: Patient tolerated treatment well Patient  left: in bed;with call bell/phone within reach  OT Visit Diagnosis: Unsteadiness on feet (R26.81);Muscle weakness (generalized) (M62.81);Other symptoms and signs involving the nervous system (R29.898)                Time: 8099-8338 OT Time Calculation (min): 18 min Charges:  OT General Charges $OT Visit: 1 Visit OT Evaluation $OT Eval Low Complexity: 1 Low  Ashby Leflore OT, MOT  Larey Seat 11/24/2021, 9:37 AM

## 2021-11-24 NOTE — Evaluation (Signed)
Physical Therapy Evaluation Patient Details Name: Kerry Vasquez MRN: [id number] DOB: 03-27-1951 Today's Date: 11/24/2021  History of Present Illness  71 year old male with a history of hypertension, prostate cancer, diabetes who presents with difficulty speaking and gait disturbance.  Wife states that he woke up this morning and alerted her that he felt weird.  She noted that he was having difficulty speaking and walking.  I was called back emergently to the room.  Last seen normal at 11 PM before going to bed.  He has some dysarthria and a slight left-sided facial droop.  He also has some slight weakness on the left greater than right.  Wife states that this is new.  Denies alcohol or drug use.   Clinical Impression  Patient functioning near baseline for functional mobility and gait other than demonstrating slightly shaky movement requiring increased time during bed mobility, transfers and ambulation in room/hallways without loss of balance.  Plan:  Patient discharged from physical therapy to care of nursing for ambulation daily as tolerated for length of stay.         Recommendations for follow up therapy are one component of a multi-disciplinary discharge planning process, led by the attending physician.  Recommendations may be updated based on patient status, additional functional criteria and insurance authorization.  Follow Up Recommendations Outpatient PT    Assistance Recommended at Discharge PRN  Patient can return home with the following  A little help with walking and/or transfers;A little help with bathing/dressing/bathroom;Help with stairs or ramp for entrance    Equipment Recommendations None recommended by PT  Recommendations for Other Services       Functional Status Assessment Patient has had a recent decline in their functional status and demonstrates the ability to make significant improvements in function in a reasonable and predictable amount of time.      Precautions / Restrictions Precautions Precautions: None Restrictions Weight Bearing Restrictions: No      Mobility  Bed Mobility Overal bed mobility: Modified Independent                  Transfers Overall transfer level: Needs assistance Equipment used: None Transfers: Sit to/from Stand, Bed to chair/wheelchair/BSC Sit to Stand: Modified independent (Device/Increase time)   Step pivot transfers: Modified independent (Device/Increase time), Supervision       General transfer comment: slightly shakey unsteady movement    Ambulation/Gait Ambulation/Gait assistance: Supervision Gait Distance (Feet): 120 Feet Assistive device: None Gait Pattern/deviations: Decreased step length - right, Decreased step length - left, Decreased stride length Gait velocity: decreased     General Gait Details: slow slightly shaky labored movement without loss of balance  Stairs            Wheelchair Mobility    Modified Rankin (Stroke Patients Only)       Balance Overall balance assessment: Mild deficits observed, not formally tested                                           Pertinent Vitals/Pain Pain Assessment Pain Assessment: No/denies pain    Home Living Family/patient expects to be discharged to:: Private residence Living Arrangements: Spouse/significant other Available Help at Discharge: Family;Available 24 hours/day Type of Home: House Home Access: Stairs to enter Entrance Stairs-Rails: Left (R has a door handle.) Entrance Stairs-Number of Steps: 2   Home Layout: One level  Prior Function Prior Level of Function : Independent/Modified Independent             Mobility Comments: Hydrographic surveyor, drives ADLs Comments: Pt reports independence for ADL's and IADL's.     Hand Dominance   Dominant Hand: Right    Extremity/Trunk Assessment   Upper Extremity Assessment Upper Extremity Assessment: Defer to OT  evaluation    Lower Extremity Assessment Lower Extremity Assessment: Overall WFL for tasks assessed    Cervical / Trunk Assessment Cervical / Trunk Assessment: Normal  Communication   Communication: No difficulties  Cognition Arousal/Alertness: Awake/alert Behavior During Therapy: WFL for tasks assessed/performed Overall Cognitive Status: Within Functional Limits for tasks assessed                                          General Comments      Exercises     Assessment/Plan    PT Assessment All further PT needs can be met in the next venue of care  PT Problem List Decreased activity tolerance;Decreased balance;Decreased mobility;Decreased strength       PT Treatment Interventions      PT Goals (Current goals can be found in the Care Plan section)  Acute Rehab PT Goals Patient Stated Goal: return home with family to assist PT Goal Formulation: With patient/family Time For Goal Achievement: 11/24/21 Potential to Achieve Goals: Good    Frequency       Co-evaluation PT/OT/SLP Co-Evaluation/Treatment: Yes Reason for Co-Treatment: To address functional/ADL transfers PT goals addressed during session: Mobility/safety with mobility;Balance;Proper use of DME         AM-PAC PT "6 Clicks" Mobility  Outcome Measure Help needed turning from your back to your side while in a flat bed without using bedrails?: None Help needed moving from lying on your back to sitting on the side of a flat bed without using bedrails?: None Help needed moving to and from a bed to a chair (including a wheelchair)?: None Help needed standing up from a chair using your arms (e.g., wheelchair or bedside chair)?: None Help needed to walk in hospital room?: A Little Help needed climbing 3-5 steps with a railing? : A Little 6 Click Score: 22    End of Session   Activity Tolerance: Patient tolerated treatment well;Patient limited by fatigue Patient left: in bed;with call  bell/phone within reach;with family/visitor present Nurse Communication: Mobility status PT Visit Diagnosis: Unsteadiness on feet (R26.81);Other abnormalities of gait and mobility (R26.89);Muscle weakness (generalized) (M62.81)    Time: 7858-8502 PT Time Calculation (min) (ACUTE ONLY): 23 min   Charges:   PT Evaluation $PT Eval Moderate Complexity: 1 Mod PT Treatments $Therapeutic Activity: 23-37 mins        1:49 PM, 11/24/21 Lonell Grandchild, MPT Physical Therapist with [facility] 336 [phone] office [phone] mobile phone

## 2021-11-24 NOTE — ED Provider Notes (Signed)
Patient presents after waking up with slurred speech and some difficulty ambulating.  He has been seen by telemetry specialist, they recommended the patient be admitted to the hospital for the rest of the stroke work-up.  He has a low NIH and does not appear to have a large vessel occlusion thus he is not a candidate for interventional therapy and he is outside the window for TNKase.  Hospitalist paged at 7:15 AM  Neurospecialists recommend full dose aspirin, this has been ordered, blood pressure remains 180s over 78  Labs have thus far been unremarkable, minimal hyperglycemia, no signs of electrolyte abnormalities, no anemia, CT scan reveals no signs of hemorrhage or obvious new stroke.  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  Dr. Denton Brick to see and evaluate for admission  Final diagnoses:  Ataxia  Slurred speech      Noemi Chapel, MD 11/24/21 [phone]

## 2021-11-24 NOTE — ED Notes (Signed)
Pt ambulated to bedside without assistance to use urinal.

## 2021-11-24 NOTE — ED Triage Notes (Signed)
Patient presents with left sided facial droop and weakness in the left leg and arms, twitch in arms and legs, slurring of speech

## 2021-11-24 NOTE — ED Notes (Signed)
Pt wife returned to room with pt home medications. Pt stated he took his morning medications, including blood pressure meds.

## 2021-11-24 NOTE — Discharge Instructions (Addendum)
1)Please take Aspirin 81 mg daily along with Plavix 75 mg daily for 30 days then after that STOP the Plavix  and continue ONLY Aspirin 81 mg daily indefinitely--for secondary stroke Prevention (Per The multicenter SAMMPRIS trial)  2)Please follow-up with Neurologist Dr. Phillips Odor-- Phone: [phone], Address: [address], [place], [place] [address] in 4 to 6 weeks for recheck and reevaluation.  Please call to make appointment with him  3) please note that your Crestor and hydrochlorothiazide as well as Toprol-XL has been adjusted--please pay  Attention to the changes in your medications  4)Avoid ibuprofen/Advil/Aleve/Motrin/Goody Powders/Naproxen/BC powders/Meloxicam/Diclofenac/Indomethacin and other Nonsteroidal anti-inflammatory medications as these will make you more likely to bleed and can cause stomach ulcers, can also cause Kidney problems.

## 2021-11-24 NOTE — ED Notes (Signed)
Physical therapy at bedside

## 2021-11-24 NOTE — Consult Note (Signed)
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 normal function. The left ventricle has no regional wall motion abnormalities. Left ventricular diastolic parameters were normal.  2. Right ventricular systolic function is normal. The right ventricular size is normal. There is normal pulmonary artery systolic pressure. The estimated right ventricular systolic pressure is 32.4 mmHg.  3. The mitral valve is grossly normal. Trivial mitral valve regurgitation.  4. The aortic valve is tricuspid. Aortic valve regurgitation is not visualized. Aortic valve sclerosis is present, with no evidence of aortic valve stenosis. Aortic valve mean gradient measures 7.0 mmHg.  5. The inferior vena cava is  normal in size with greater than 50% respiratory variability, suggesting right atrial pressure of 3 mmHg. Comparison(s): No prior Echocardiogram. FINDINGS  Left Ventricle: Left ventricular ejection fraction, by estimation, is 60 to 65%. The left ventricle has normal function. The left ventricle has no regional wall motion abnormalities.  The left ventricular internal cavity size was normal in size. There is  no left ventricular hypertrophy. Left ventricular diastolic parameters were normal. Right Ventricle: The right ventricular size is normal. No increase in right ventricular wall thickness. Right ventricular systolic function is normal. There is normal pulmonary artery systolic pressure. The tricuspid regurgitant velocity is 1.87 m/s, and  with an assumed right atrial pressure of 3 mmHg, the estimated right ventricular systolic pressure is 67.3 mmHg. Left Atrium: Left atrial size was normal in size. Right Atrium: Right atrial size was normal in size. Pericardium: There is no evidence of pericardial effusion. Mitral Valve: The mitral valve is grossly normal. Mild mitral annular calcification. Trivial mitral valve regurgitation. MV peak gradient, 4.2 mmHg. The mean mitral valve gradient is 1.0 mmHg. Tricuspid Valve: The tricuspid valve is grossly normal. Tricuspid valve regurgitation is trivial. Aortic Valve: The aortic valve is tricuspid. There is mild aortic valve annular calcification. Aortic valve regurgitation is not visualized. Aortic valve sclerosis is present, with no evidence of aortic valve stenosis. Aortic valve mean gradient measures  7.0 mmHg. Aortic valve peak gradient measures 13.0 mmHg. Aortic valve area, by VTI measures 2.44 cm. Pulmonic Valve: The pulmonic valve was grossly normal. Pulmonic valve regurgitation is trivial. Aorta: The aortic root is normal in size and structure. Venous: The inferior vena cava is normal in size with greater than 50% respiratory variability, suggesting right atrial  pressure of 3 mmHg. IAS/Shunts: No atrial level shunt detected by color flow Doppler.  LEFT VENTRICLE PLAX 2D LVIDd:         5.40 cm     Diastology LVIDs:         3.10 cm     LV e' medial:    7.88 cm/s LV PW:         1.20 cm     LV E/e' medial:  10.3 LV IVS:        1.20 cm     LV e' lateral:   10.60 cm/s LVOT diam:     2.00 cm     LV E/e' lateral: 7.7 LV SV:         105 LV SV Index:   42 LVOT Area:     3.14 cm  LV Volumes (MOD) LV vol d, MOD A2C: 86.2 ml LV vol d, MOD A4C: 97.6 ml LV vol s, MOD A2C: 36.2 ml LV vol s, MOD A4C: 39.2 ml LV SV MOD A2C:     50.0 ml LV SV MOD A4C:     97.6 ml LV SV MOD BP:      53.5 ml RIGHT VENTRICLE RV Basal diam:  3.75 cm RV Mid diam:    3.30 cm RV S prime:     15.70 cm/s TAPSE (M-mode): 2.6 cm LEFT ATRIUM             Index        RIGHT ATRIUM           Index LA diam:        4.50 cm 1.78 cm/m   RA Area:     19.50 cm LA Vol (A2C):   76.8 ml 30.41 ml/m  RA Volume:   54.50 ml  21.58 ml/m LA Vol (A4C):   55.4 ml 21.94 ml/m LA Biplane Vol: 67.2 ml 26.61 ml/m  AORTIC VALVE                     PULMONIC VALVE AV Area (Vmax):  2.79 cm      PV Vmax:       0.87 m/s AV Area (Vmean):   2.17 cm      PV Peak grad:  3.1 mmHg AV Area (VTI):     2.44 cm AV Vmax:           180.00 cm/s AV Vmean:          125.000 cm/s AV VTI:            0.432 m AV Peak Grad:      13.0 mmHg AV Mean Grad:      7.0 mmHg LVOT Vmax:         160.00 cm/s LVOT Vmean:        86.200 cm/s LVOT VTI:          0.335 m LVOT/AV VTI ratio: 0.78  AORTA Ao Root diam: 3.20 cm Ao Asc diam:  3.30 cm MITRAL VALVE               TRICUSPID VALVE MV Area (PHT): 2.78 cm    TR Peak grad:   14.0 mmHg MV Area VTI:   2.69 cm    TR Vmax:        187.00 cm/s MV Peak grad:  4.2 mmHg MV Mean grad:  1.0 mmHg    SHUNTS MV Vmax:       1.02 m/s    Systemic VTI:  0.34 m MV Vmean:      54.0 cm/s   Systemic Diam: 2.00 cm MV Decel Time: 273 msec MV E velocity: 81.40 cm/s MV A velocity: 98.10 cm/s MV E/A ratio:  0.83 Rozann Lesches MD Electronically  signed by Rozann Lesches MD Signature Date/Time: 11/24/2021/10:15:31 AM    Final    CT HEAD CODE STROKE WO CONTRAST  Result Date: 11/24/2021 CLINICAL DATA:  Code stroke. Sudden onset weakness and slurred speech EXAM: CT HEAD WITHOUT CONTRAST TECHNIQUE: Contiguous axial images were obtained from the base of the skull through the vertex without intravenous contrast. RADIATION DOSE REDUCTION: This exam was performed according to the departmental dose-optimization program which includes automated exposure control, adjustment of the mA and/or kV according to patient size and/or use of iterative reconstruction technique. COMPARISON:  09/25/2006 head CT and brain MRI 05/04/2012 FINDINGS: Brain: No evidence of acute infarction, hemorrhage, hydrocephalus, extra-axial collection or mass lesion/mass effect. Anterior right frontal lobe encephalomalacia at site of prior meningioma resection. Vascular: No hyperdense vessel or unexpected calcification. Skull: Unremarkable right frontal craniotomy Sinuses/Orbits: Unremarkable Other: These results were called by telephone at the time of interpretation on 11/24/2021 at 6:41 am to provider [facility] , who verbally acknowledged these results. IMPRESSION: 1. No acute finding 2. Right frontal encephalomalacia at site of prior meningioma resection. Electronically Signed   By: Jorje Guild M.D.   On: 11/24/2021 06:41    Family Communication: Admission, patients condition and plan of care including tests being ordered have been discussed with the patient and wife at bedside who indicate understanding and agree with the plan   Code Status - Full Code  Condition   stable  Roxan Hockey M.D on 11/24/2021 at 1:32 PM Go to [email or web address] -  for contact info  [facility] - Office  [phone]
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
## 2021-12-17 DIAGNOSIS — I10 Essential (primary) hypertension: Secondary | ICD-10-CM | POA: Diagnosis not present

## 2021-12-17 DIAGNOSIS — G4733 Obstructive sleep apnea (adult) (pediatric): Secondary | ICD-10-CM | POA: Diagnosis not present

## 2021-12-17 DIAGNOSIS — E1169 Type 2 diabetes mellitus with other specified complication: Secondary | ICD-10-CM | POA: Diagnosis not present

## 2021-12-17 DIAGNOSIS — Z8546 Personal history of malignant neoplasm of prostate: Secondary | ICD-10-CM | POA: Diagnosis not present

## 2021-12-17 DIAGNOSIS — Z8673 Personal history of transient ischemic attack (TIA), and cerebral infarction without residual deficits: Secondary | ICD-10-CM | POA: Diagnosis not present

## 2022-01-18 DIAGNOSIS — G459 Transient cerebral ischemic attack, unspecified: Secondary | ICD-10-CM | POA: Diagnosis not present

## 2022-01-18 DIAGNOSIS — E118 Type 2 diabetes mellitus with unspecified complications: Secondary | ICD-10-CM | POA: Diagnosis not present

## 2022-01-18 DIAGNOSIS — G4733 Obstructive sleep apnea (adult) (pediatric): Secondary | ICD-10-CM | POA: Diagnosis not present

## 2022-01-18 DIAGNOSIS — I10 Essential (primary) hypertension: Secondary | ICD-10-CM | POA: Diagnosis not present

## 2022-02-01 DIAGNOSIS — G40319 Generalized idiopathic epilepsy and epileptic syndromes, intractable, without status epilepticus: Secondary | ICD-10-CM | POA: Diagnosis not present

## 2022-02-24 ENCOUNTER — Other Ambulatory Visit: Payer: Medicare Other

## 2022-03-02 ENCOUNTER — Other Ambulatory Visit: Payer: Medicare Other

## 2022-03-02 DIAGNOSIS — C61 Malignant neoplasm of prostate: Secondary | ICD-10-CM

## 2022-03-03 ENCOUNTER — Ambulatory Visit: Payer: Medicare Other | Admitting: Urology

## 2022-03-03 LAB — PSA: Prostate Specific Ag, Serum: 0.3 ng/mL (ref 0.0–4.0)

## 2022-03-10 ENCOUNTER — Ambulatory Visit (INDEPENDENT_AMBULATORY_CARE_PROVIDER_SITE_OTHER): Payer: Medicare Other | Admitting: Urology

## 2022-03-10 VITALS — BP 151/60 | HR 70

## 2022-03-10 DIAGNOSIS — R9721 Rising PSA following treatment for malignant neoplasm of prostate: Secondary | ICD-10-CM | POA: Diagnosis not present

## 2022-03-10 DIAGNOSIS — C61 Malignant neoplasm of prostate: Secondary | ICD-10-CM

## 2022-03-10 DIAGNOSIS — Z8546 Personal history of malignant neoplasm of prostate: Secondary | ICD-10-CM

## 2022-03-10 LAB — URINALYSIS, ROUTINE W REFLEX MICROSCOPIC
Bilirubin, UA: NEGATIVE
Glucose, UA: NEGATIVE
Leukocytes,UA: NEGATIVE
Nitrite, UA: NEGATIVE
Protein,UA: NEGATIVE
RBC, UA: NEGATIVE
Specific Gravity, UA: 1.025 (ref 1.005–1.030)
Urobilinogen, Ur: 0.2 mg/dL (ref 0.2–1.0)
pH, UA: 5.5 (ref 5.0–7.5)
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
## 2022-03-11 ENCOUNTER — Encounter: Payer: Self-pay | Admitting: Urology

## 2022-06-20 DIAGNOSIS — I10 Essential (primary) hypertension: Secondary | ICD-10-CM | POA: Diagnosis not present

## 2022-06-20 DIAGNOSIS — E1169 Type 2 diabetes mellitus with other specified complication: Secondary | ICD-10-CM | POA: Diagnosis not present

## 2022-06-20 DIAGNOSIS — Z8546 Personal history of malignant neoplasm of prostate: Secondary | ICD-10-CM | POA: Diagnosis not present

## 2022-06-20 DIAGNOSIS — G4733 Obstructive sleep apnea (adult) (pediatric): Secondary | ICD-10-CM | POA: Diagnosis not present

## 2022-06-20 DIAGNOSIS — Z1331 Encounter for screening for depression: Secondary | ICD-10-CM | POA: Diagnosis not present

## 2022-06-20 DIAGNOSIS — Z8673 Personal history of transient ischemic attack (TIA), and cerebral infarction without residual deficits: Secondary | ICD-10-CM | POA: Diagnosis not present

## 2022-06-20 DIAGNOSIS — Z0000 Encounter for general adult medical examination without abnormal findings: Secondary | ICD-10-CM | POA: Diagnosis not present

## 2022-06-21 DIAGNOSIS — H524 Presbyopia: Secondary | ICD-10-CM | POA: Diagnosis not present

## 2022-06-21 DIAGNOSIS — E119 Type 2 diabetes mellitus without complications: Secondary | ICD-10-CM | POA: Diagnosis not present

## 2022-06-21 DIAGNOSIS — H538 Other visual disturbances: Secondary | ICD-10-CM | POA: Diagnosis not present

## 2022-07-05 DIAGNOSIS — I10 Essential (primary) hypertension: Secondary | ICD-10-CM | POA: Diagnosis not present

## 2022-07-05 DIAGNOSIS — E118 Type 2 diabetes mellitus with unspecified complications: Secondary | ICD-10-CM | POA: Diagnosis not present

## 2022-07-05 DIAGNOSIS — G4733 Obstructive sleep apnea (adult) (pediatric): Secondary | ICD-10-CM | POA: Diagnosis not present

## 2022-08-23 DIAGNOSIS — Z23 Encounter for immunization: Secondary | ICD-10-CM | POA: Diagnosis not present

## 2022-09-08 ENCOUNTER — Other Ambulatory Visit: Payer: Medicare Other

## 2022-09-08 DIAGNOSIS — C61 Malignant neoplasm of prostate: Secondary | ICD-10-CM

## 2022-09-09 LAB — PSA: Prostate Specific Ag, Serum: 0.4 ng/mL (ref 0.0–4.0)

## 2022-09-15 ENCOUNTER — Ambulatory Visit (INDEPENDENT_AMBULATORY_CARE_PROVIDER_SITE_OTHER): Payer: Medicare Other | Admitting: Urology

## 2022-09-15 ENCOUNTER — Encounter: Payer: Self-pay | Admitting: Urology

## 2022-09-15 VITALS — BP 163/69 | HR 88

## 2022-09-15 DIAGNOSIS — Z8546 Personal history of malignant neoplasm of prostate: Secondary | ICD-10-CM

## 2022-09-15 DIAGNOSIS — C61 Malignant neoplasm of prostate: Secondary | ICD-10-CM | POA: Diagnosis not present

## 2022-09-15 DIAGNOSIS — R9721 Rising PSA following treatment for malignant neoplasm of prostate: Secondary | ICD-10-CM
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
## 2022-09-16 LAB — URINALYSIS, ROUTINE W REFLEX MICROSCOPIC
Bilirubin, UA: NEGATIVE
Glucose, UA: NEGATIVE
Leukocytes,UA: NEGATIVE
Nitrite, UA: NEGATIVE
RBC, UA: NEGATIVE
Specific Gravity, UA: 1.015 (ref 1.005–1.030)
Urobilinogen, Ur: 1 mg/dL (ref 0.2–1.0)
pH, UA: 6.5 (ref 5.0–7.5)

## 2022-09-16 LAB — MICROSCOPIC EXAMINATION: Bacteria, UA: NONE SEEN

## 2022-09-29 DIAGNOSIS — Z23 Encounter for immunization: Secondary | ICD-10-CM | POA: Diagnosis not present

## 2022-10-18 DIAGNOSIS — G4733 Obstructive sleep apnea (adult) (pediatric): Secondary | ICD-10-CM | POA: Diagnosis not present

## 2022-10-18 DIAGNOSIS — I10 Essential (primary) hypertension: Secondary | ICD-10-CM | POA: Diagnosis not present

## 2022-12-21 DIAGNOSIS — Z8673 Personal history of transient ischemic attack (TIA), and cerebral infarction without residual deficits: Secondary | ICD-10-CM | POA: Diagnosis not present

## 2022-12-21 DIAGNOSIS — G4733 Obstructive sleep apnea (adult) (pediatric): Secondary | ICD-10-CM | POA: Diagnosis not present

## 2022-12-21 DIAGNOSIS — I10 Essential (primary) hypertension: Secondary | ICD-10-CM | POA: Diagnosis not present

## 2022-12-21 DIAGNOSIS — E1169 Type 2 diabetes mellitus with other specified complication: Secondary | ICD-10-CM | POA: Diagnosis not present

## 2022-12-21 DIAGNOSIS — Z8546 Personal history of malignant neoplasm of prostate: Secondary | ICD-10-CM | POA: Diagnosis not present

## 2022-12-21 DIAGNOSIS — Z23 Encounter for immunization: Secondary | ICD-10-CM | POA: Diagnosis not present

## 2023-03-16 ENCOUNTER — Other Ambulatory Visit: Payer: Medicare Other

## 2023-03-16 DIAGNOSIS — C61 Malignant neoplasm of prostate: Secondary | ICD-10-CM

## 2023-03-16 DIAGNOSIS — R9721 Rising PSA following treatment for malignant neoplasm of prostate: Secondary | ICD-10-CM

## 2023-03-17 LAB — PSA: Prostate Specific Ag, Serum: 0.5 ng/mL (ref 0.0–4.0)

## 2023-03-23 ENCOUNTER — Ambulatory Visit (INDEPENDENT_AMBULATORY_CARE_PROVIDER_SITE_OTHER): Payer: Medicare Other | Admitting: Urology

## 2023-03-23 ENCOUNTER — Encounter: Payer: Self-pay | Admitting: Urology

## 2023-03-23 VITALS — BP 118/67 | HR 70

## 2023-03-23 DIAGNOSIS — R9721 Rising PSA following treatment for malignant neoplasm of prostate: Secondary | ICD-10-CM

## 2023-03-23 DIAGNOSIS — C61 Malignant neoplasm of prostate: Secondary | ICD-10-CM | POA: Diagnosis not present

## 2023-03-23 LAB — URINALYSIS, ROUTINE W REFLEX MICROSCOPIC
Bilirubin, UA: NEGATIVE
Glucose, UA: NEGATIVE
Ketones, UA: NEGATIVE
Leukocytes,UA: NEGATIVE
Nitrite, UA: NEGATIVE
RBC, UA: NEGATIVE
Specific Gravity, UA: 1.015 (ref 1.005–1.030)
Urobilinogen, Ur: 1 mg/dL (ref 0.2–1.0)
pH, UA: 5.5 (ref 5.0–7.5)

## 2023-03-23 LAB — MICROSCOPIC EXAMINATION: Bacteria, UA: NONE SEEN
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
## 2023-04-06 ENCOUNTER — Encounter (HOSPITAL_COMMUNITY)
Admission: RE | Admit: 2023-04-06 | Discharge: 2023-04-06 | Disposition: A | Discharge status: Home / Self Care | Payer: Medicare Other | Source: Ambulatory Visit | Attending: Urology | Admitting: Urology

## 2023-04-06 DIAGNOSIS — C61 Malignant neoplasm of prostate: Secondary | ICD-10-CM | POA: Diagnosis not present

## 2023-04-06 DIAGNOSIS — R9721 Rising PSA following treatment for malignant neoplasm of prostate: Secondary | ICD-10-CM | POA: Diagnosis not present

## 2023-04-06 MED ORDER — PIFLIFOLASTAT F 18 (PYLARIFY) INJECTION
9.0000 | Freq: Once | INTRAVENOUS | Status: AC
  Administered 2023-04-06: 8.95 via INTRAVENOUS

## 2023-04-06 NOTE — Progress Notes (Signed)
STAT read request placed for PSMA PET from 6/6 due to radiation oncology consult on 6/7.

## 2023-04-07 ENCOUNTER — Ambulatory Visit
Admission: RE | Admit: 2023-04-07 | Discharge: 2023-04-07 | Disposition: A | Discharge status: Home / Self Care | Payer: Medicare Other | Source: Ambulatory Visit | Attending: Radiation Oncology | Admitting: Radiation Oncology

## 2023-04-07 ENCOUNTER — Other Ambulatory Visit: Payer: Self-pay

## 2023-04-07 ENCOUNTER — Ambulatory Visit: Payer: Medicare Other

## 2023-04-07 ENCOUNTER — Ambulatory Visit: Payer: Medicare Other | Admitting: Radiation Oncology

## 2023-04-07 ENCOUNTER — Encounter: Payer: Self-pay | Admitting: Radiation Oncology

## 2023-04-07 VITALS — Ht 73.0 in | Wt 316.0 lb

## 2023-04-07 DIAGNOSIS — R9721 Rising PSA following treatment for malignant neoplasm of prostate: Secondary | ICD-10-CM | POA: Diagnosis not present

## 2023-04-07 DIAGNOSIS — C61 Malignant neoplasm of prostate: Secondary | ICD-10-CM | POA: Diagnosis not present

## 2023-04-07 NOTE — Progress Notes (Signed)
GU Location of Tumor / Histology: Prostate Ca  Prostatectomy (2005) Gleason 7(3+4) that was T2/3.  His PSA has been rising slowly and was 0.17 in 8/21, 0.22 in 2/22 and 0.3 in 8/22, 11/22 and on 03/02/22. It is now up to 0.4 on 09/08/22.  It has been slowly rising with a PSADT of 2-3 years but that appears to be shortening.    PSA is (0.5 on 03/16/2023)  Past/Anticipated interventions by urology, if any:   Dr. Bjorn Pippin 03/23/2023    Past/Anticipated interventions by medical oncology, if any: NA  Weight changes, if any: No  IPSS:  2 SHIM:  1  Bowel/Bladder complaints, if any: No   Nausea/Vomiting, if any: No  Pain issues, if any:  0/10  SAFETY ISSUES: Prior radiation? No Pacemaker/ICD? No Possible current pregnancy? Male Is the patient on methotrexate? No  Current Complaints / other details:  No
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
## 2023-04-10 NOTE — Progress Notes (Signed)
RN mailed requested documents to patient per his request.

## 2023-04-18 NOTE — Progress Notes (Signed)
Spoke with patient via telephone to introduce myself as the prostate nurse navigator and discussed my role.  No barriers to care identified at this time.  Patient had questions regarding the normal PSMA pet scan, and why he would need radiation.  RN provided education on this is indication that there is no measurable disease, however, we know there is residual prostate cancer based on the PSA.  He verbalized understanding and was very thankful.    I provided my contact information and asked for him to call me with questions or concerns.  Verbalized understanding.  I will reach out to patient after his wife's surgery on 7/8 to review getting him set up for PSA check in [place].

## 2023-04-25 ENCOUNTER — Telehealth: Payer: Self-pay | Admitting: *Deleted

## 2023-04-25 NOTE — Telephone Encounter (Signed)
RETURNED PATIENT'S PHONE CALL, SPOKE WITH PATIENT. ?

## 2023-04-27 NOTE — Progress Notes (Signed)
Patient has a telephone consult and was wanting to meet Dr. Kathrynn Running and Lebron Quam in person recently when his wife had a scheduled surgery.   RN left voicemail for patient informing him that he would be meeting Dr. Kathrynn Running or Lebron Quam, PA-C when he has his CT Simulation.   RN will follow up in July to assess good date for PSA post wife's surgery.

## 2023-05-15 IMAGING — CT CT ANGIO HEAD-NECK (W OR W/O PERF)
2 of 7 series · 8 of 33 positions shown · IV contrast (Omnipaque or Isovue)
Comparison: Head CT from earlier today.

CLINICAL DATA: Code stroke earlier today.

EXAM:
CT ANGIOGRAPHY HEAD AND NECK
TECHNIQUE: Multidetector CT imaging of the head and neck was performed using
the standard protocol during bolus administration of intravenous
contrast. Multiplanar CT image reconstructions and MIPs were
obtained to evaluate the vascular anatomy. Carotid stenosis
measurements (when applicable) are obtained utilizing NASCET
criteria, using the distal internal carotid diameter as the
denominator.

[Series 4: cta head & neck · axial · 0.54mm/px · z∈[+133,+253]mm · 2 of 182 slices shown]
[im 61/182  soft-tissue]
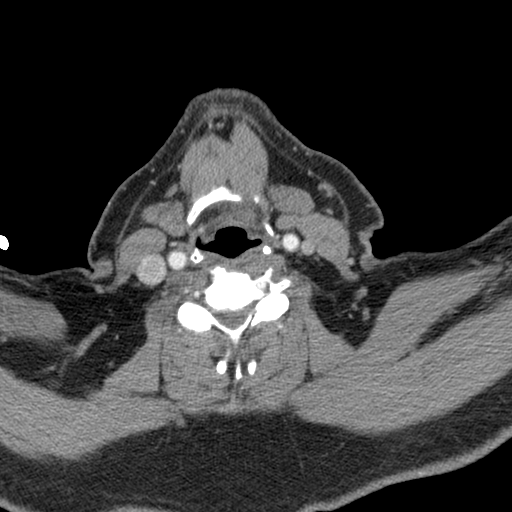
[im 121/182  soft-tissue]
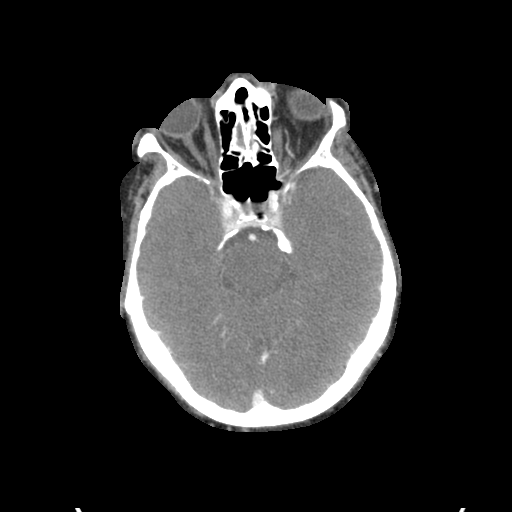

[Series 6: ax thins · axial · 0.50mm/px · z∈[+65,+324]mm · 6 of 363 slices shown]
[im 52/363  soft-tissue]
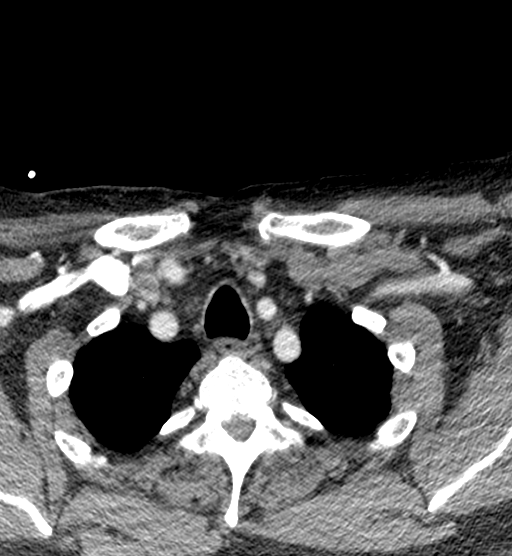
[im 104/363  bone]
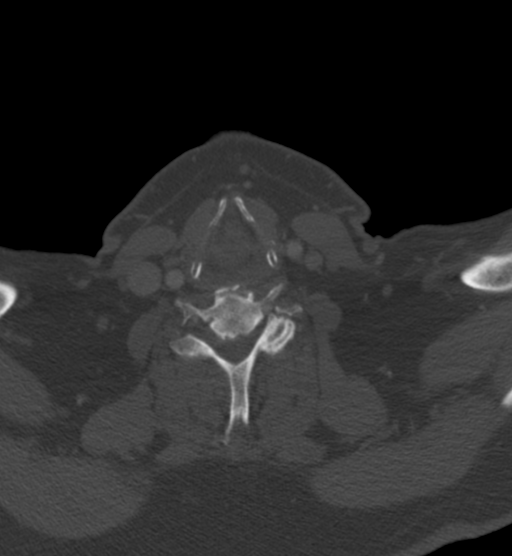
[im 156/363  soft-tissue]
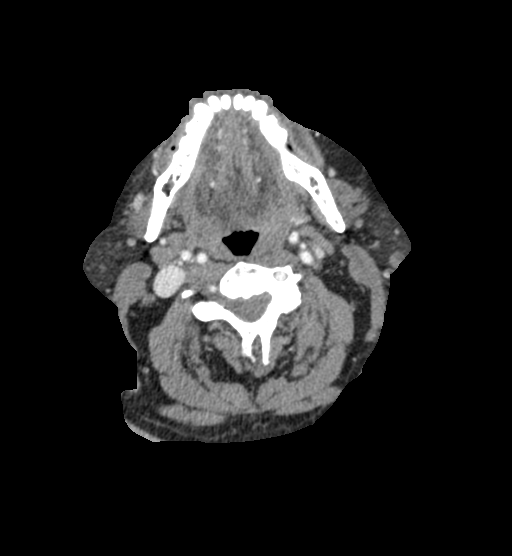
[im 207/363  bone]
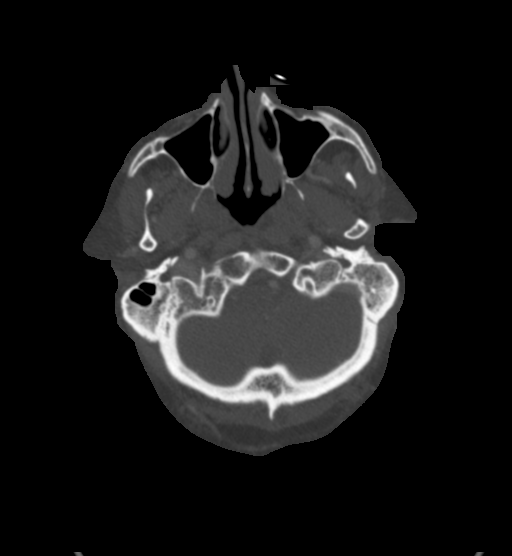
[im 259/363  soft-tissue]
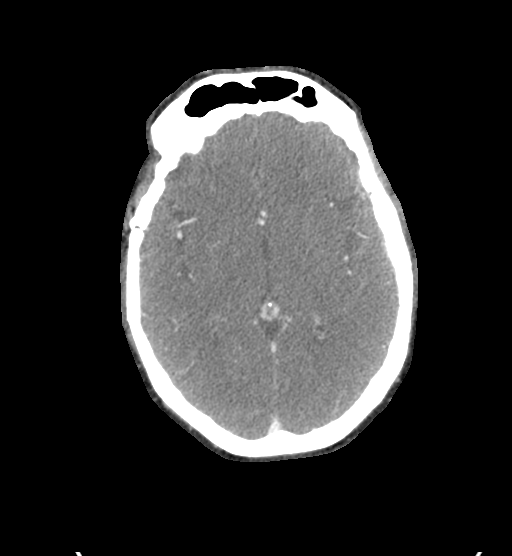
[im 311/363  bone]
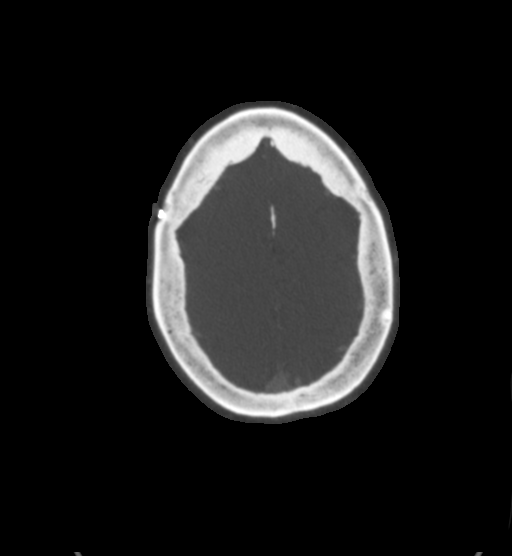

[8 of 33 positions shown; findings below may reference images not displayed]

RADIATION DOSE REDUCTION: This exam was performed according to the
departmental dose-optimization program which includes automated
exposure control, adjustment of the mA and/or kV according to
patient size and/or use of iterative reconstruction technique.

CONTRAST:  75mL OMNIPAQUE IOHEXOL 350 MG/ML SOLN
FINDINGS: CTA NECK FINDINGS

Aortic arch: Normal with 3 vessel branching.

Right carotid system: Mild atheromatous plaque at the bifurcation.
No stenosis or ulceration.

Left carotid system: Mild atheromatous plaque at the bifurcation. No
stenosis or ulceration.

Vertebral arteries: No proximal subclavian stenosis. The left
vertebral artery is dominant. Both vertebral arteries are smoothly
contoured and widely patent to the dura.

Skeleton: Atlantooccipital non segmentation. Cervical spine
degeneration with left C3-4 facet ankylosis.

Other neck: No acute finding

Upper chest: No acute finding

Review of the MIP images confirms the above findings

CTA HEAD FINDINGS

Anterior circulation: Atheromatous plaque at the carotid siphons. No
branch occlusion, ulceration, beading, or flow limiting stenosis of
major vessels.

Posterior circulation: Strong left vertebral artery dominance. The
right vertebral artery ends in PICA. Mild left vertebral
atherosclerotic calcification. Unremarkable basilar and posterior
cerebral arteries. Negative for aneurysm

Venous sinuses: Diffusely patent

Anatomic variants: None significant

Review of the MIP images confirms the above findings
IMPRESSION: 1. No emergent finding.
2. Mild atherosclerosis without flow limiting stenosis of major
vessels.

## 2023-05-15 IMAGING — CT CT HEAD CODE STROKE
3 series · 15 of 47 positions shown, 18 images · non-contrast
Comparison: 09/25/2006 head CT and brain MRI 05/04/2012

CLINICAL DATA: Code stroke. Sudden onset weakness and slurred
speech



[Series 2: head w o · axial · 0.47mm/px · z∈[+32,+172]mm · 9 of 34 slices shown, 12 images]
[im 3/34  brain]
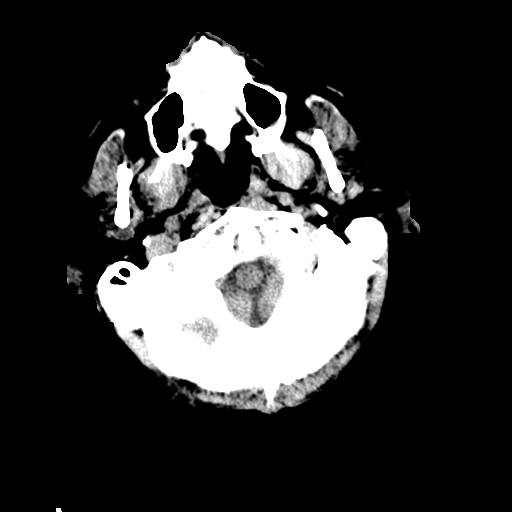
[im 3/34  bone]
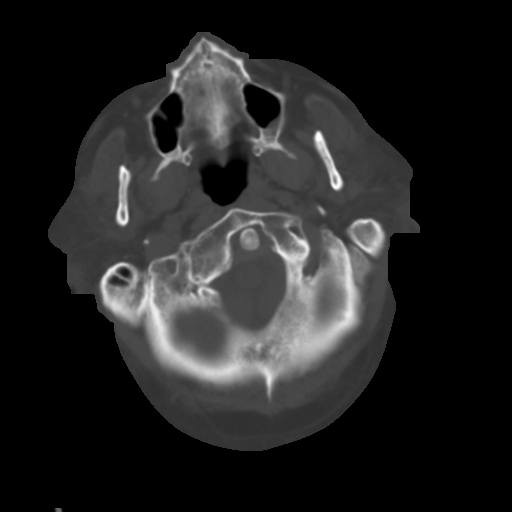
[im 6/34  brain]
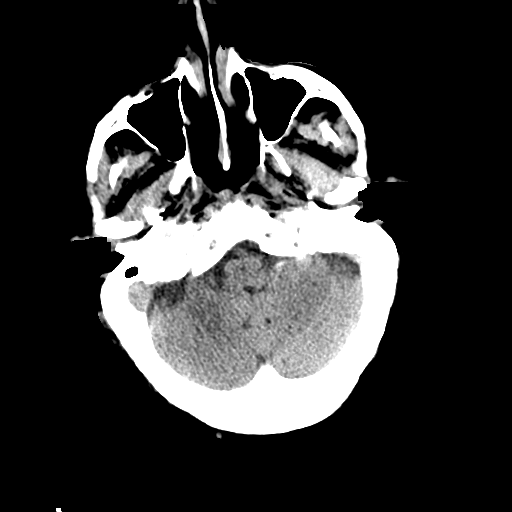
[im 10/34  brain]
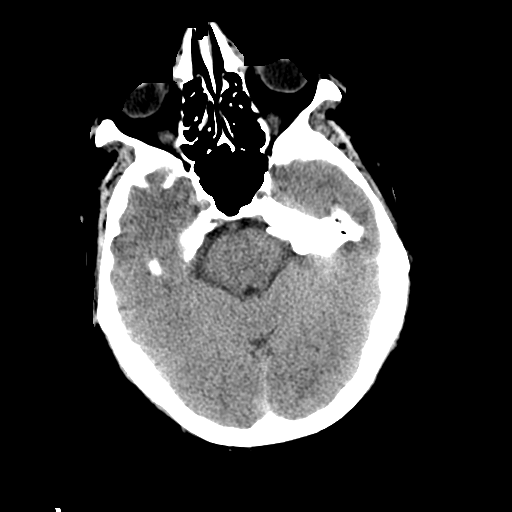
[im 13/34  brain]
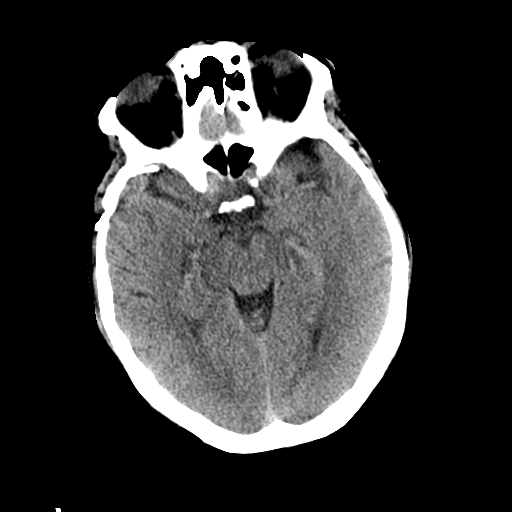
[im 18/34  brain]
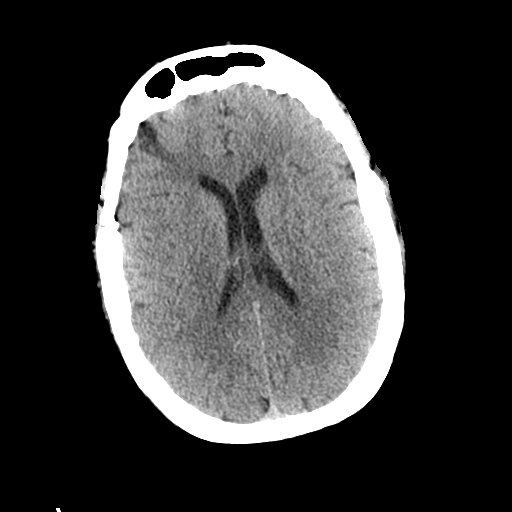
[im 18/34  bone]
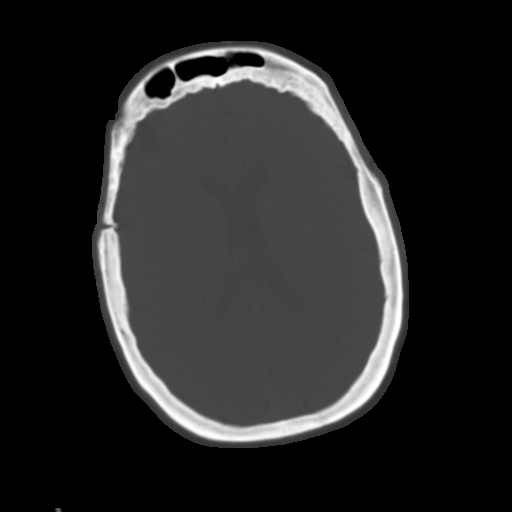
[im 21/34  brain]
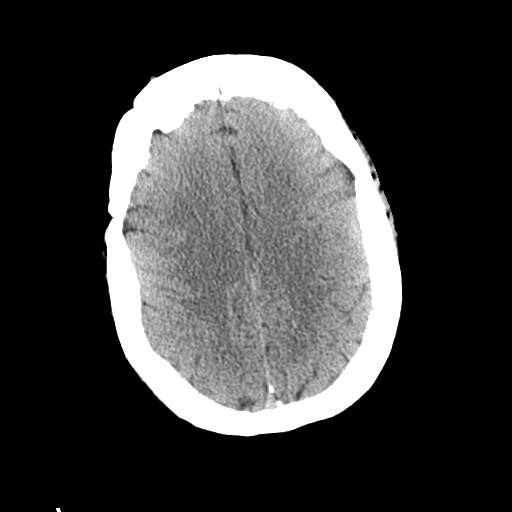
[im 24/34  brain]
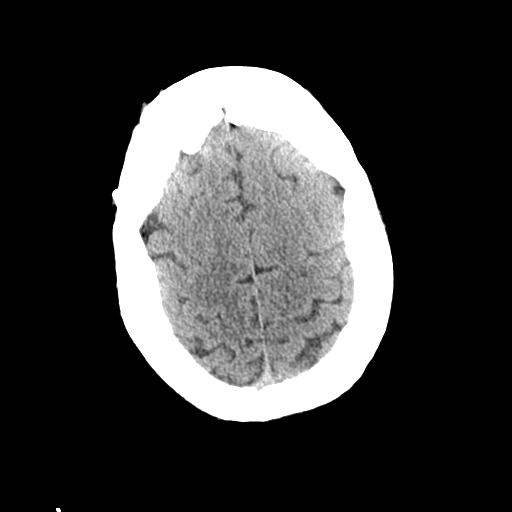
[im 28/34  brain]
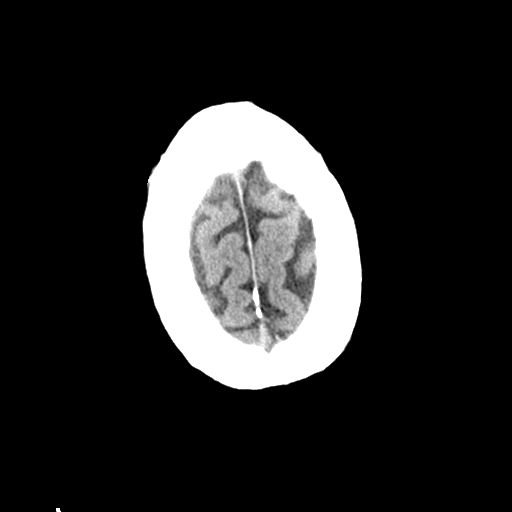
[im 31/34  brain]
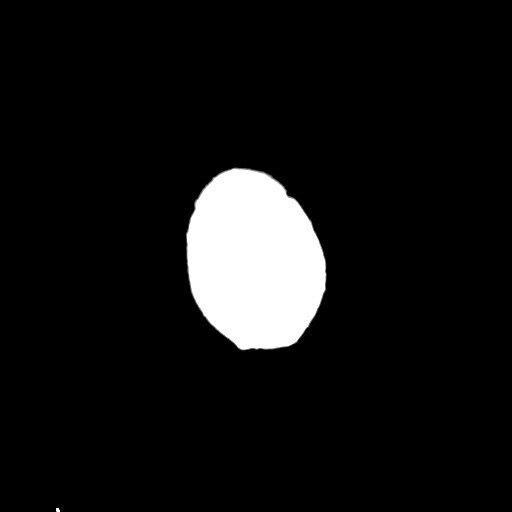
[im 31/34  bone]
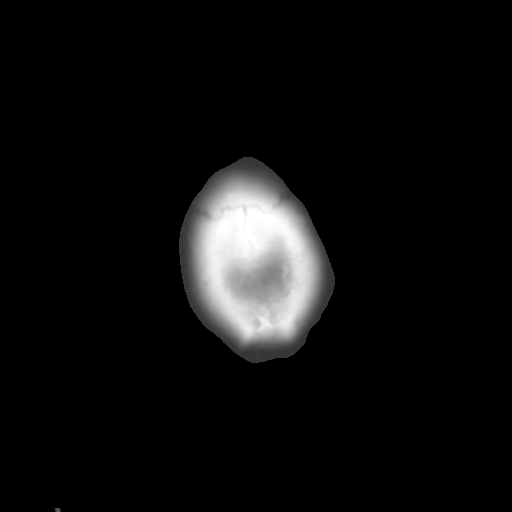

[Series 4: coronal soft · coronal · 0.32mm/px · 3 of 72 slices shown]
[im 24/72  brain]
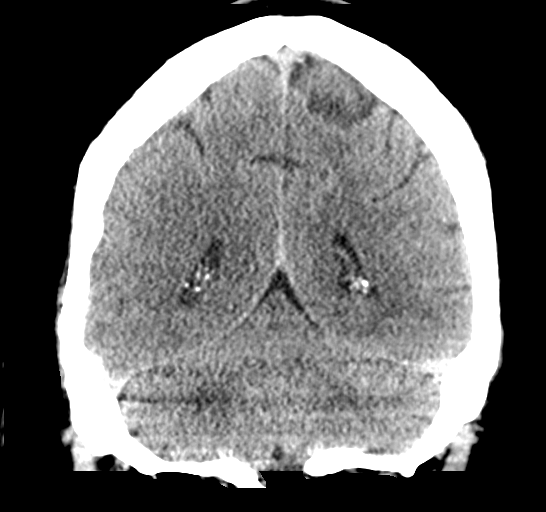
[im 32/72  brain]
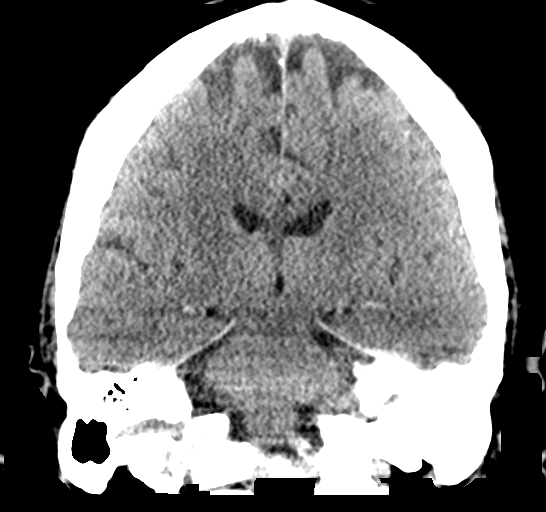
[im 40/72  brain]
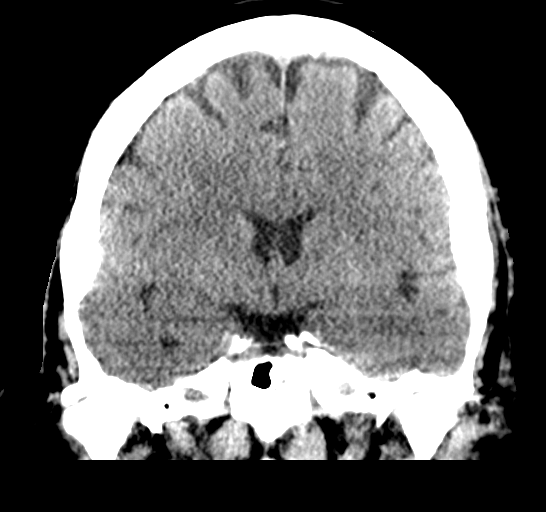

[Series 5: sagittal soft · sagittal · 0.34mm/px · 3 of 53 slices shown]
[im 18/53  brain]
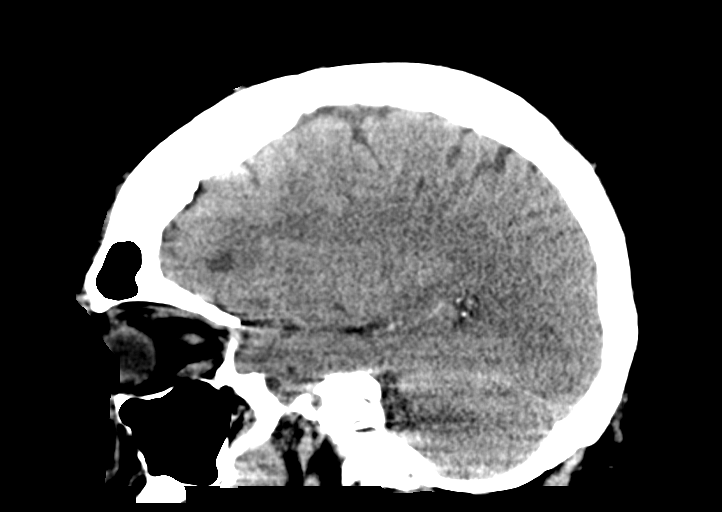
[im 27/53  brain]
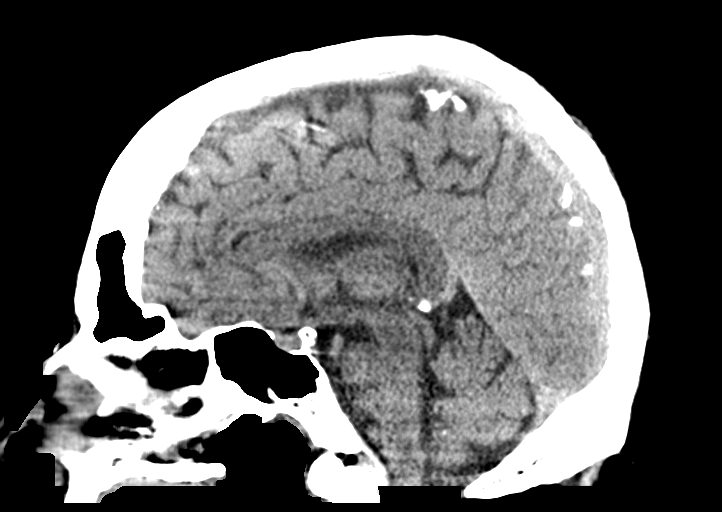
[im 35/53  brain]
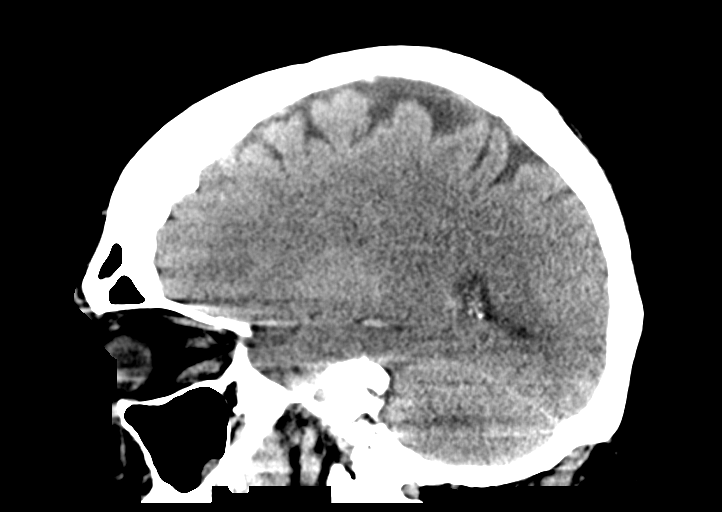

[15 of 47 positions shown; findings below may reference images not displayed]

FINDINGS: Brain: No evidence of acute infarction, hemorrhage, hydrocephalus,
extra-axial collection or mass lesion/mass effect. Anterior right
frontal lobe encephalomalacia at site of prior meningioma resection.

Vascular: No hyperdense vessel or unexpected calcification.

Skull: Unremarkable right frontal craniotomy

Sinuses/Orbits: Unremarkable

Other: These results were called by telephone at the time of
interpretation on 11/24/2021 at [DATE] to provider LETIOMALO RESTO ,
who verbally acknowledged these results.
IMPRESSION: 1. No acute finding
2. Right frontal encephalomalacia at site of prior meningioma
resection.

## 2023-05-15 NOTE — Progress Notes (Signed)
RN left message for call back to access when would be a good date for him to have his PSA check prior to starting radiation treatment.   Fiducial's, spaceOAR, and CT Simulation appointments pending.  Request to follow up placed.

## 2023-05-17 ENCOUNTER — Telehealth: Payer: Self-pay

## 2023-05-17 DIAGNOSIS — C61 Malignant neoplasm of prostate: Secondary | ICD-10-CM

## 2023-05-17 NOTE — Telephone Encounter (Signed)
Patient called office to let Dr. Annabell Howells know Dr. Kathrynn Running would like to have Dr. Annabell Howells order PSA before patient decides on which treatment.   Patient would also like to know if Dr. Annabell Howells would like to see patient after PSA test. Patient is scheduled to start Radiation in August.  Message sent to MD.

## 2023-05-17 NOTE — Telephone Encounter (Signed)
Patient called and notified of Dr. Annabell Howells notification of PSA and appointment.

## 2023-05-18 NOTE — Progress Notes (Signed)
Pt is scheduled for PSA check on 7/25 prior to anticipation of radiation starting in August.    RN will follow up with patient once results are final with PSA, patient aware.

## 2023-05-25 ENCOUNTER — Other Ambulatory Visit: Payer: Medicare Other

## 2023-05-25 DIAGNOSIS — R9721 Rising PSA following treatment for malignant neoplasm of prostate: Secondary | ICD-10-CM | POA: Diagnosis not present

## 2023-05-25 DIAGNOSIS — C61 Malignant neoplasm of prostate: Secondary | ICD-10-CM | POA: Diagnosis not present

## 2023-05-29 ENCOUNTER — Telehealth: Payer: Self-pay | Admitting: *Deleted

## 2023-05-29 NOTE — Telephone Encounter (Signed)
Called patient to ask about coming for [facility]  visit and sim on 06-01-23, spoke with patient and he agreed to come for these appts.
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
## 2023-05-31 NOTE — Progress Notes (Signed)
  Radiation Oncology         [phone]) [phone] ________________________________  Name: Kerry Vasquez MRN: [id number]  Date: 06/01/2023  DOB: [birth year]  SIMULATION AND TREATMENT PLANNING NOTE    ICD-10-CM   1. Biochemically recurrent malignant neoplasm of prostate [facility])  C61    R97.21       DIAGNOSIS:  72 y/o man with biochemically recurrent prostate cancer with PSA at 0.5 s/p RRP in 03/2004 for pT2/3,N0 Gleason 3+4 adenocarcinoma of the prostate   NARRATIVE:  The patient was brought to the CT Simulation planning suite.  Identity was confirmed.  All relevant records and images related to the planned course of therapy were reviewed.  The patient freely provided informed written consent to proceed with treatment after reviewing the details related to the planned course of therapy. The consent form was witnessed and verified by the simulation staff.  Then, the patient was set-up in a stable reproducible supine position for radiation therapy.  A vacuum lock pillow device was custom fabricated to position his legs in a reproducible immobilized position.  Then, I performed a urethrogram under sterile conditions to identify the prostatic apex.  CT images were obtained.  Surface markings were placed.  The CT images were loaded into the planning software.  Then the prostate target and avoidance structures including the rectum, bladder, bowel and hips were contoured.  Treatment planning then occurred.  The radiation prescription was entered and confirmed.  A total of one complex treatment devices was fabricated. I have requested : Intensity Modulated Radiotherapy (IMRT) is medically necessary for this case for the following reason:  Rectal sparing.Marland Kitchen  PLAN:   The prostate fossa and pelvic lymph nodes will initially be treated to 45 Gy in 25 fractions of 1.8 Gy followed by a boost to the fossa only, to 68.4 Gy with 13 additional fractions of 1.8 Gy without ADT.   ________________________________  Artist Pais  Kathrynn Running, M.D.

## 2023-06-01 ENCOUNTER — Other Ambulatory Visit: Payer: Self-pay

## 2023-06-01 ENCOUNTER — Encounter: Payer: Self-pay | Admitting: Urology

## 2023-06-01 ENCOUNTER — Ambulatory Visit
Admission: RE | Admit: 2023-06-01 | Discharge: 2023-06-01 | Disposition: A | Discharge status: Home / Self Care | Payer: Medicare Other | Source: Ambulatory Visit | Attending: Radiation Oncology | Admitting: Radiation Oncology

## 2023-06-01 ENCOUNTER — Ambulatory Visit
Admission: RE | Admit: 2023-06-01 | Discharge: 2023-06-01 | Disposition: A | Discharge status: Home / Self Care | Payer: Medicare Other | Source: Ambulatory Visit | Attending: Urology | Admitting: Urology

## 2023-06-01 VITALS — BP 129/58 | HR 70 | Temp 97.3°F | Resp 20 | Ht 73.0 in | Wt 309.4 lb

## 2023-06-01 DIAGNOSIS — J45909 Unspecified asthma, uncomplicated: Secondary | ICD-10-CM | POA: Insufficient documentation

## 2023-06-01 DIAGNOSIS — Z7982 Long term (current) use of aspirin: Secondary | ICD-10-CM | POA: Insufficient documentation

## 2023-06-01 DIAGNOSIS — Z8719 Personal history of other diseases of the digestive system: Secondary | ICD-10-CM | POA: Diagnosis not present

## 2023-06-01 DIAGNOSIS — Z510 Encounter for antineoplastic radiation therapy: Secondary | ICD-10-CM | POA: Insufficient documentation

## 2023-06-01 DIAGNOSIS — I10 Essential (primary) hypertension: Secondary | ICD-10-CM | POA: Insufficient documentation

## 2023-06-01 DIAGNOSIS — C61 Malignant neoplasm of prostate: Secondary | ICD-10-CM | POA: Insufficient documentation

## 2023-06-01 DIAGNOSIS — E119 Type 2 diabetes mellitus without complications: Secondary | ICD-10-CM | POA: Insufficient documentation

## 2023-06-01 DIAGNOSIS — R9721 Rising PSA following treatment for malignant neoplasm of prostate: Secondary | ICD-10-CM | POA: Diagnosis not present

## 2023-06-01 DIAGNOSIS — Z79899 Other long term (current) drug therapy: Secondary | ICD-10-CM | POA: Insufficient documentation

## 2023-06-01 NOTE — Progress Notes (Signed)
Nursing interview for biochemically recurrent prostate cancer with PSA at 0.5 s/p RRP in 03/2004 for pT2/3,N0 Gleason 3+4 adenocarcinoma of the prostate.  Patient identity verified x2.  Patient reports no discomfort or urinary imbalances. No issues conveyed at this time.  Meaningful use complete.  I-PSS score of 5-mild SHIM score of 1- "No sexual activity within the past 6 months" Urinary management medications- None Urology appt- 09/2023 w/ Dr. Annabell Howells at [facility] [place]  Vitals- BP (!) 129/58 (BP Location: Left Arm, Patient Position: Sitting, Cuff Size: Large)   Pulse 70   Temp (!) 97.3 F (36.3 C) (Temporal)   Resp 20   Ht 6\' 1"  (1.854 m)   Wt (!) 309 lb 6.4 oz (140.3 kg)   SpO2 99%   BMI 40.82 kg/m    This concludes the interaction.  Ruel Favors, LPN

## 2023-06-05 DIAGNOSIS — C61 Malignant neoplasm of prostate: Secondary | ICD-10-CM | POA: Diagnosis not present

## 2023-06-05 DIAGNOSIS — Z510 Encounter for antineoplastic radiation therapy: Secondary | ICD-10-CM | POA: Diagnosis not present

## 2023-06-05 DIAGNOSIS — R9721 Rising PSA following treatment for malignant neoplasm of prostate: Secondary | ICD-10-CM | POA: Diagnosis not present

## 2023-06-15 ENCOUNTER — Other Ambulatory Visit: Payer: Self-pay

## 2023-06-15 ENCOUNTER — Ambulatory Visit: Payer: Medicare Other

## 2023-06-15 ENCOUNTER — Ambulatory Visit
Admission: RE | Admit: 2023-06-15 | Discharge: 2023-06-15 | Disposition: A | Discharge status: Home / Self Care | Payer: Medicare Other | Source: Ambulatory Visit | Attending: Radiation Oncology | Admitting: Radiation Oncology

## 2023-06-15 DIAGNOSIS — C61 Malignant neoplasm of prostate: Secondary | ICD-10-CM | POA: Diagnosis not present

## 2023-06-15 DIAGNOSIS — R9721 Rising PSA following treatment for malignant neoplasm of prostate: Secondary | ICD-10-CM | POA: Diagnosis not present

## 2023-06-15 DIAGNOSIS — Z510 Encounter for antineoplastic radiation therapy: Secondary | ICD-10-CM | POA: Diagnosis not present

## 2023-06-15 LAB — RAD ONC ARIA SESSION SUMMARY
Course Elapsed Days: 0
Plan Fractions Treated to Date: 1
Plan Prescribed Dose Per Fraction: 1.8 Gy
Plan Total Fractions Prescribed: 25
Plan Total Prescribed Dose: 45 Gy
Reference Point Dosage Given to Date: 1.8 Gy
Reference Point Session Dosage Given: 1.8 Gy
Session Number: 1

## 2023-06-16 ENCOUNTER — Ambulatory Visit: Payer: Medicare Other

## 2023-06-16 ENCOUNTER — Ambulatory Visit: Admission: RE | Admit: 2023-06-16 | Payer: Medicare Other | Source: Ambulatory Visit

## 2023-06-16 ENCOUNTER — Ambulatory Visit
Admission: RE | Admit: 2023-06-16 | Discharge: 2023-06-16 | Disposition: A | Discharge status: Home / Self Care | Payer: Medicare Other | Source: Ambulatory Visit | Attending: Radiation Oncology | Admitting: Radiation Oncology

## 2023-06-16 ENCOUNTER — Other Ambulatory Visit: Payer: Self-pay

## 2023-06-16 DIAGNOSIS — R9721 Rising PSA following treatment for malignant neoplasm of prostate: Secondary | ICD-10-CM | POA: Diagnosis not present

## 2023-06-16 DIAGNOSIS — Z510 Encounter for antineoplastic radiation therapy: Secondary | ICD-10-CM | POA: Diagnosis not present

## 2023-06-16 DIAGNOSIS — C61 Malignant neoplasm of prostate: Secondary | ICD-10-CM | POA: Diagnosis not present

## 2023-06-16 LAB — RAD ONC ARIA SESSION SUMMARY
Course Elapsed Days: 1
Plan Fractions Treated to Date: 2
Plan Prescribed Dose Per Fraction: 1.8 Gy
Plan Total Fractions Prescribed: 25
Plan Total Prescribed Dose: 45 Gy
Reference Point Dosage Given to Date: 3.6 Gy
Reference Point Session Dosage Given: 1.8 Gy
Session Number: 2

## 2023-06-19 ENCOUNTER — Ambulatory Visit: Payer: Medicare Other

## 2023-06-19 ENCOUNTER — Ambulatory Visit
Admission: RE | Admit: 2023-06-19 | Discharge: 2023-06-19 | Disposition: A | Discharge status: Home / Self Care | Payer: Medicare Other | Source: Ambulatory Visit | Attending: Radiation Oncology | Admitting: Radiation Oncology

## 2023-06-19 ENCOUNTER — Other Ambulatory Visit: Payer: Self-pay

## 2023-06-19 DIAGNOSIS — R9721 Rising PSA following treatment for malignant neoplasm of prostate: Secondary | ICD-10-CM | POA: Diagnosis not present

## 2023-06-19 DIAGNOSIS — C61 Malignant neoplasm of prostate: Secondary | ICD-10-CM | POA: Diagnosis not present

## 2023-06-19 DIAGNOSIS — Z510 Encounter for antineoplastic radiation therapy: Secondary | ICD-10-CM | POA: Diagnosis not present

## 2023-06-19 LAB — RAD ONC ARIA SESSION SUMMARY
Course Elapsed Days: 4
Plan Fractions Treated to Date: 3
Plan Prescribed Dose Per Fraction: 1.8 Gy
Plan Total Fractions Prescribed: 25
Plan Total Prescribed Dose: 45 Gy
Reference Point Dosage Given to Date: 5.4 Gy
Reference Point Session Dosage Given: 1.8 Gy
Session Number: 3

## 2023-06-20 ENCOUNTER — Other Ambulatory Visit: Payer: Self-pay

## 2023-06-20 ENCOUNTER — Ambulatory Visit
Admission: RE | Admit: 2023-06-20 | Discharge: 2023-06-20 | Disposition: A | Discharge status: Home / Self Care | Payer: Medicare Other | Source: Ambulatory Visit | Attending: Radiation Oncology | Admitting: Radiation Oncology

## 2023-06-20 ENCOUNTER — Ambulatory Visit: Payer: Medicare Other

## 2023-06-20 DIAGNOSIS — R9721 Rising PSA following treatment for malignant neoplasm of prostate: Secondary | ICD-10-CM | POA: Diagnosis not present

## 2023-06-20 DIAGNOSIS — C61 Malignant neoplasm of prostate: Secondary | ICD-10-CM | POA: Diagnosis not present

## 2023-06-20 DIAGNOSIS — Z510 Encounter for antineoplastic radiation therapy: Secondary | ICD-10-CM | POA: Diagnosis not present

## 2023-06-20 LAB — RAD ONC ARIA SESSION SUMMARY
Course Elapsed Days: 5
Plan Fractions Treated to Date: 4
Plan Prescribed Dose Per Fraction: 1.8 Gy
Plan Total Fractions Prescribed: 25
Plan Total Prescribed Dose: 45 Gy
Reference Point Dosage Given to Date: 7.2 Gy
Reference Point Session Dosage Given: 1.8 Gy
Session Number: 4

## 2023-06-21 ENCOUNTER — Telehealth: Payer: Self-pay

## 2023-06-21 ENCOUNTER — Other Ambulatory Visit: Payer: Self-pay

## 2023-06-21 ENCOUNTER — Ambulatory Visit: Admission: RE | Admit: 2023-06-21 | Payer: Medicare Other | Source: Ambulatory Visit

## 2023-06-21 DIAGNOSIS — C61 Malignant neoplasm of prostate: Secondary | ICD-10-CM | POA: Diagnosis not present

## 2023-06-21 DIAGNOSIS — Z510 Encounter for antineoplastic radiation therapy: Secondary | ICD-10-CM | POA: Diagnosis not present

## 2023-06-21 DIAGNOSIS — R9721 Rising PSA following treatment for malignant neoplasm of prostate: Secondary | ICD-10-CM | POA: Diagnosis not present

## 2023-06-21 LAB — RAD ONC ARIA SESSION SUMMARY
Course Elapsed Days: 6
Plan Fractions Treated to Date: 5
Plan Prescribed Dose Per Fraction: 1.8 Gy
Plan Total Fractions Prescribed: 25
Plan Total Prescribed Dose: 45 Gy
Reference Point Dosage Given to Date: 9 Gy
Reference Point Session Dosage Given: 1.8 Gy
Session Number: 5

## 2023-06-21 NOTE — Telephone Encounter (Signed)
Kerry Vasquez called reports his wife tested positive for COVID she started having symptoms yesterday afternoon.  Kerry Vasquez said he didn't test himself because he hasn't had any symptoms.  RN advised him to go ahead and test himself and check his temperature it's early and just because he doesn't have symptoms right now doesn't mean he's negative.  RN informed him that if we decide to have him come in he will need to wear a mask and that his time may be moved to the end of the day.  Kerry Vasquez is receiving treatment for prostate cancer and doesn't want to miss any scheduled appointments.

## 2023-06-21 NOTE — Telephone Encounter (Signed)
Kerry Vasquez called back results of his home COVID test was negative and took his temperature 97.4.  He was advised that he will received treatment at his scheduled time 2:00 pm today, he will need to wear mask per radiation therapist.  [name] was also reminded that if anything changes before treatment time to inform us right away.

## 2023-06-22 ENCOUNTER — Ambulatory Visit
Admission: RE | Admit: 2023-06-22 | Discharge: 2023-06-22 | Disposition: A | Discharge status: Home / Self Care | Payer: Medicare Other | Source: Ambulatory Visit | Attending: Radiation Oncology | Admitting: Radiation Oncology

## 2023-06-22 ENCOUNTER — Ambulatory Visit: Payer: Medicare Other

## 2023-06-22 ENCOUNTER — Other Ambulatory Visit: Payer: Self-pay

## 2023-06-22 DIAGNOSIS — C61 Malignant neoplasm of prostate: Secondary | ICD-10-CM | POA: Diagnosis not present

## 2023-06-22 DIAGNOSIS — Z510 Encounter for antineoplastic radiation therapy: Secondary | ICD-10-CM | POA: Diagnosis not present

## 2023-06-22 DIAGNOSIS — R9721 Rising PSA following treatment for malignant neoplasm of prostate: Secondary | ICD-10-CM | POA: Diagnosis not present

## 2023-06-22 LAB — RAD ONC ARIA SESSION SUMMARY
Course Elapsed Days: 7
Plan Fractions Treated to Date: 6
Plan Prescribed Dose Per Fraction: 1.8 Gy
Plan Total Fractions Prescribed: 25
Plan Total Prescribed Dose: 45 Gy
Reference Point Dosage Given to Date: 10.8 Gy
Reference Point Session Dosage Given: 1.8 Gy
Session Number: 6

## 2023-06-23 ENCOUNTER — Other Ambulatory Visit: Payer: Self-pay

## 2023-06-23 ENCOUNTER — Ambulatory Visit: Payer: Medicare Other

## 2023-06-23 ENCOUNTER — Ambulatory Visit
Admission: RE | Admit: 2023-06-23 | Discharge: 2023-06-23 | Disposition: A | Discharge status: Home / Self Care | Payer: Medicare Other | Source: Ambulatory Visit | Attending: Radiation Oncology | Admitting: Radiation Oncology

## 2023-06-23 ENCOUNTER — Ambulatory Visit: Admission: RE | Admit: 2023-06-23 | Payer: Medicare Other | Source: Ambulatory Visit

## 2023-06-23 DIAGNOSIS — C61 Malignant neoplasm of prostate: Secondary | ICD-10-CM | POA: Diagnosis not present

## 2023-06-23 DIAGNOSIS — Z510 Encounter for antineoplastic radiation therapy: Secondary | ICD-10-CM | POA: Diagnosis not present

## 2023-06-23 DIAGNOSIS — R9721 Rising PSA following treatment for malignant neoplasm of prostate: Secondary | ICD-10-CM | POA: Diagnosis not present

## 2023-06-23 LAB — RAD ONC ARIA SESSION SUMMARY
Course Elapsed Days: 8
Plan Fractions Treated to Date: 7
Plan Prescribed Dose Per Fraction: 1.8 Gy
Plan Total Fractions Prescribed: 25
Plan Total Prescribed Dose: 45 Gy
Reference Point Dosage Given to Date: 12.6 Gy
Reference Point Session Dosage Given: 1.8 Gy
Session Number: 7

## 2023-06-26 ENCOUNTER — Ambulatory Visit
Admission: RE | Admit: 2023-06-26 | Discharge: 2023-06-26 | Disposition: A | Discharge status: Home / Self Care | Payer: Medicare Other | Source: Ambulatory Visit | Attending: Radiation Oncology | Admitting: Radiation Oncology

## 2023-06-26 ENCOUNTER — Other Ambulatory Visit: Payer: Self-pay

## 2023-06-26 ENCOUNTER — Ambulatory Visit: Payer: Medicare Other

## 2023-06-26 DIAGNOSIS — Z510 Encounter for antineoplastic radiation therapy: Secondary | ICD-10-CM | POA: Diagnosis not present

## 2023-06-26 DIAGNOSIS — C61 Malignant neoplasm of prostate: Secondary | ICD-10-CM | POA: Diagnosis not present

## 2023-06-26 DIAGNOSIS — R9721 Rising PSA following treatment for malignant neoplasm of prostate: Secondary | ICD-10-CM | POA: Diagnosis not present

## 2023-06-26 LAB — RAD ONC ARIA SESSION SUMMARY
Course Elapsed Days: 11
Plan Fractions Treated to Date: 8
Plan Prescribed Dose Per Fraction: 1.8 Gy
Plan Total Fractions Prescribed: 25
Plan Total Prescribed Dose: 45 Gy
Reference Point Dosage Given to Date: 14.4 Gy
Reference Point Session Dosage Given: 1.8 Gy
Session Number: 8

## 2023-06-27 ENCOUNTER — Ambulatory Visit: Payer: Medicare Other

## 2023-06-27 ENCOUNTER — Other Ambulatory Visit: Payer: Self-pay

## 2023-06-27 ENCOUNTER — Ambulatory Visit
Admission: RE | Admit: 2023-06-27 | Discharge: 2023-06-27 | Disposition: A | Discharge status: Home / Self Care | Payer: Medicare Other | Source: Ambulatory Visit | Attending: Radiation Oncology | Admitting: Radiation Oncology

## 2023-06-27 DIAGNOSIS — R9721 Rising PSA following treatment for malignant neoplasm of prostate: Secondary | ICD-10-CM | POA: Diagnosis not present

## 2023-06-27 DIAGNOSIS — Z510 Encounter for antineoplastic radiation therapy: Secondary | ICD-10-CM | POA: Diagnosis not present

## 2023-06-27 DIAGNOSIS — C61 Malignant neoplasm of prostate: Secondary | ICD-10-CM | POA: Diagnosis not present

## 2023-06-27 LAB — RAD ONC ARIA SESSION SUMMARY
Course Elapsed Days: 12
Plan Fractions Treated to Date: 9
Plan Prescribed Dose Per Fraction: 1.8 Gy
Plan Total Fractions Prescribed: 25
Plan Total Prescribed Dose: 45 Gy
Reference Point Dosage Given to Date: 16.2 Gy
Reference Point Session Dosage Given: 1.8 Gy
Session Number: 9

## 2023-06-28 ENCOUNTER — Ambulatory Visit: Payer: Medicare Other

## 2023-06-28 ENCOUNTER — Ambulatory Visit
Admission: RE | Admit: 2023-06-28 | Discharge: 2023-06-28 | Disposition: A | Discharge status: Home / Self Care | Payer: Medicare Other | Source: Ambulatory Visit | Attending: Radiation Oncology | Admitting: Radiation Oncology

## 2023-06-28 ENCOUNTER — Other Ambulatory Visit: Payer: Self-pay

## 2023-06-28 DIAGNOSIS — C61 Malignant neoplasm of prostate: Secondary | ICD-10-CM | POA: Diagnosis not present

## 2023-06-28 DIAGNOSIS — Z510 Encounter for antineoplastic radiation therapy: Secondary | ICD-10-CM | POA: Diagnosis not present

## 2023-06-28 DIAGNOSIS — R9721 Rising PSA following treatment for malignant neoplasm of prostate: Secondary | ICD-10-CM | POA: Diagnosis not present

## 2023-06-28 LAB — RAD ONC ARIA SESSION SUMMARY
Course Elapsed Days: 13
Plan Fractions Treated to Date: 10
Plan Prescribed Dose Per Fraction: 1.8 Gy
Plan Total Fractions Prescribed: 25
Plan Total Prescribed Dose: 45 Gy
Reference Point Dosage Given to Date: 18 Gy
Reference Point Session Dosage Given: 1.8 Gy
Session Number: 10

## 2023-06-29 ENCOUNTER — Other Ambulatory Visit: Payer: Self-pay

## 2023-06-29 ENCOUNTER — Ambulatory Visit
Admission: RE | Admit: 2023-06-29 | Discharge: 2023-06-29 | Disposition: A | Discharge status: Home / Self Care | Payer: Medicare Other | Source: Ambulatory Visit | Attending: Radiation Oncology | Admitting: Radiation Oncology

## 2023-06-29 ENCOUNTER — Ambulatory Visit: Payer: Medicare Other

## 2023-06-29 DIAGNOSIS — R9721 Rising PSA following treatment for malignant neoplasm of prostate: Secondary | ICD-10-CM | POA: Diagnosis not present

## 2023-06-29 DIAGNOSIS — C61 Malignant neoplasm of prostate: Secondary | ICD-10-CM | POA: Diagnosis not present

## 2023-06-29 DIAGNOSIS — Z510 Encounter for antineoplastic radiation therapy: Secondary | ICD-10-CM | POA: Diagnosis not present

## 2023-06-29 LAB — RAD ONC ARIA SESSION SUMMARY
Course Elapsed Days: 14
Plan Fractions Treated to Date: 11
Plan Prescribed Dose Per Fraction: 1.8 Gy
Plan Total Fractions Prescribed: 25
Plan Total Prescribed Dose: 45 Gy
Reference Point Dosage Given to Date: 19.8 Gy
Reference Point Session Dosage Given: 1.8 Gy
Session Number: 11

## 2023-06-30 ENCOUNTER — Other Ambulatory Visit: Payer: Self-pay

## 2023-06-30 ENCOUNTER — Ambulatory Visit
Admission: RE | Admit: 2023-06-30 | Discharge: 2023-06-30 | Disposition: A | Discharge status: Home / Self Care | Payer: Medicare Other | Source: Ambulatory Visit | Attending: Radiation Oncology | Admitting: Radiation Oncology

## 2023-06-30 ENCOUNTER — Ambulatory Visit: Payer: Medicare Other

## 2023-06-30 DIAGNOSIS — R9721 Rising PSA following treatment for malignant neoplasm of prostate: Secondary | ICD-10-CM | POA: Diagnosis not present

## 2023-06-30 DIAGNOSIS — C61 Malignant neoplasm of prostate: Secondary | ICD-10-CM | POA: Diagnosis not present

## 2023-06-30 DIAGNOSIS — Z510 Encounter for antineoplastic radiation therapy: Secondary | ICD-10-CM | POA: Diagnosis not present

## 2023-06-30 LAB — RAD ONC ARIA SESSION SUMMARY
Course Elapsed Days: 15
Plan Fractions Treated to Date: 12
Plan Prescribed Dose Per Fraction: 1.8 Gy
Plan Total Fractions Prescribed: 25
Plan Total Prescribed Dose: 45 Gy
Reference Point Dosage Given to Date: 21.6 Gy
Reference Point Session Dosage Given: 1.8 Gy
Session Number: 12

## 2023-07-04 ENCOUNTER — Ambulatory Visit
Admission: RE | Admit: 2023-07-04 | Discharge: 2023-07-04 | Disposition: A | Discharge status: Home / Self Care | Payer: Medicare Other | Source: Ambulatory Visit | Attending: Radiation Oncology | Admitting: Radiation Oncology

## 2023-07-04 ENCOUNTER — Ambulatory Visit: Payer: Medicare Other

## 2023-07-04 ENCOUNTER — Other Ambulatory Visit: Payer: Self-pay

## 2023-07-04 DIAGNOSIS — C61 Malignant neoplasm of prostate: Secondary | ICD-10-CM | POA: Diagnosis not present

## 2023-07-04 DIAGNOSIS — R9721 Rising PSA following treatment for malignant neoplasm of prostate: Secondary | ICD-10-CM | POA: Diagnosis not present

## 2023-07-04 DIAGNOSIS — Z510 Encounter for antineoplastic radiation therapy: Secondary | ICD-10-CM | POA: Diagnosis not present

## 2023-07-04 LAB — RAD ONC ARIA SESSION SUMMARY
Course Elapsed Days: 19
Plan Fractions Treated to Date: 13
Plan Prescribed Dose Per Fraction: 1.8 Gy
Plan Total Fractions Prescribed: 25
Plan Total Prescribed Dose: 45 Gy
Reference Point Dosage Given to Date: 23.4 Gy
Reference Point Session Dosage Given: 1.8 Gy
Session Number: 13

## 2023-07-05 ENCOUNTER — Ambulatory Visit: Admission: RE | Admit: 2023-07-05 | Payer: Medicare Other | Source: Ambulatory Visit

## 2023-07-05 ENCOUNTER — Other Ambulatory Visit: Payer: Self-pay

## 2023-07-05 ENCOUNTER — Ambulatory Visit: Payer: Medicare Other

## 2023-07-05 DIAGNOSIS — R9721 Rising PSA following treatment for malignant neoplasm of prostate: Secondary | ICD-10-CM | POA: Diagnosis not present

## 2023-07-05 DIAGNOSIS — Z510 Encounter for antineoplastic radiation therapy: Secondary | ICD-10-CM | POA: Diagnosis not present

## 2023-07-05 DIAGNOSIS — C61 Malignant neoplasm of prostate: Secondary | ICD-10-CM | POA: Diagnosis not present

## 2023-07-05 LAB — RAD ONC ARIA SESSION SUMMARY
Course Elapsed Days: 20
Plan Fractions Treated to Date: 14
Plan Prescribed Dose Per Fraction: 1.8 Gy
Plan Total Fractions Prescribed: 25
Plan Total Prescribed Dose: 45 Gy
Reference Point Dosage Given to Date: 25.2 Gy
Reference Point Session Dosage Given: 1.8 Gy
Session Number: 14

## 2023-07-06 ENCOUNTER — Ambulatory Visit: Payer: Medicare Other

## 2023-07-06 ENCOUNTER — Ambulatory Visit
Admission: RE | Admit: 2023-07-06 | Discharge: 2023-07-06 | Disposition: A | Discharge status: Home / Self Care | Payer: Medicare Other | Source: Ambulatory Visit | Attending: Radiation Oncology | Admitting: Radiation Oncology

## 2023-07-06 ENCOUNTER — Other Ambulatory Visit: Payer: Self-pay

## 2023-07-06 DIAGNOSIS — C61 Malignant neoplasm of prostate: Secondary | ICD-10-CM | POA: Diagnosis not present

## 2023-07-06 DIAGNOSIS — Z510 Encounter for antineoplastic radiation therapy: Secondary | ICD-10-CM | POA: Diagnosis not present

## 2023-07-06 DIAGNOSIS — R9721 Rising PSA following treatment for malignant neoplasm of prostate: Secondary | ICD-10-CM | POA: Diagnosis not present

## 2023-07-06 LAB — RAD ONC ARIA SESSION SUMMARY
Course Elapsed Days: 21
Plan Fractions Treated to Date: 15
Plan Prescribed Dose Per Fraction: 1.8 Gy
Plan Total Fractions Prescribed: 25
Plan Total Prescribed Dose: 45 Gy
Reference Point Dosage Given to Date: 27 Gy
Reference Point Session Dosage Given: 1.8 Gy
Session Number: 15

## 2023-07-07 ENCOUNTER — Ambulatory Visit
Admission: RE | Admit: 2023-07-07 | Discharge: 2023-07-07 | Disposition: A | Discharge status: Home / Self Care | Payer: Medicare Other | Source: Ambulatory Visit | Attending: Radiation Oncology

## 2023-07-07 ENCOUNTER — Other Ambulatory Visit: Payer: Self-pay

## 2023-07-07 ENCOUNTER — Ambulatory Visit: Payer: Medicare Other

## 2023-07-07 DIAGNOSIS — R9721 Rising PSA following treatment for malignant neoplasm of prostate: Secondary | ICD-10-CM | POA: Diagnosis not present

## 2023-07-07 DIAGNOSIS — Z510 Encounter for antineoplastic radiation therapy: Secondary | ICD-10-CM | POA: Diagnosis not present

## 2023-07-07 DIAGNOSIS — C61 Malignant neoplasm of prostate: Secondary | ICD-10-CM | POA: Diagnosis not present

## 2023-07-07 LAB — RAD ONC ARIA SESSION SUMMARY
Course Elapsed Days: 22
Plan Fractions Treated to Date: 16
Plan Prescribed Dose Per Fraction: 1.8 Gy
Plan Total Fractions Prescribed: 25
Plan Total Prescribed Dose: 45 Gy
Reference Point Dosage Given to Date: 28.8 Gy
Reference Point Session Dosage Given: 1.8 Gy
Session Number: 16

## 2023-07-10 ENCOUNTER — Other Ambulatory Visit: Payer: Self-pay

## 2023-07-10 ENCOUNTER — Ambulatory Visit: Payer: Medicare Other

## 2023-07-10 ENCOUNTER — Ambulatory Visit
Admission: RE | Admit: 2023-07-10 | Discharge: 2023-07-10 | Disposition: A | Discharge status: Home / Self Care | Payer: Medicare Other | Source: Ambulatory Visit | Attending: Radiation Oncology

## 2023-07-10 DIAGNOSIS — Z510 Encounter for antineoplastic radiation therapy: Secondary | ICD-10-CM | POA: Diagnosis not present

## 2023-07-10 DIAGNOSIS — R9721 Rising PSA following treatment for malignant neoplasm of prostate: Secondary | ICD-10-CM | POA: Diagnosis not present

## 2023-07-10 DIAGNOSIS — C61 Malignant neoplasm of prostate: Secondary | ICD-10-CM | POA: Diagnosis not present

## 2023-07-10 LAB — RAD ONC ARIA SESSION SUMMARY
Course Elapsed Days: 25
Plan Fractions Treated to Date: 17
Plan Prescribed Dose Per Fraction: 1.8 Gy
Plan Total Fractions Prescribed: 25
Plan Total Prescribed Dose: 45 Gy
Reference Point Dosage Given to Date: 30.6 Gy
Reference Point Session Dosage Given: 1.8 Gy
Session Number: 17

## 2023-07-11 ENCOUNTER — Ambulatory Visit: Payer: Medicare Other

## 2023-07-11 ENCOUNTER — Ambulatory Visit
Admission: RE | Admit: 2023-07-11 | Discharge: 2023-07-11 | Disposition: A | Discharge status: Home / Self Care | Payer: Medicare Other | Source: Ambulatory Visit | Attending: Radiation Oncology

## 2023-07-11 ENCOUNTER — Other Ambulatory Visit: Payer: Self-pay

## 2023-07-11 DIAGNOSIS — R9721 Rising PSA following treatment for malignant neoplasm of prostate: Secondary | ICD-10-CM | POA: Diagnosis not present

## 2023-07-11 DIAGNOSIS — Z510 Encounter for antineoplastic radiation therapy: Secondary | ICD-10-CM | POA: Diagnosis not present

## 2023-07-11 DIAGNOSIS — C61 Malignant neoplasm of prostate: Secondary | ICD-10-CM | POA: Diagnosis not present

## 2023-07-11 LAB — RAD ONC ARIA SESSION SUMMARY
Course Elapsed Days: 26
Plan Fractions Treated to Date: 18
Plan Prescribed Dose Per Fraction: 1.8 Gy
Plan Total Fractions Prescribed: 25
Plan Total Prescribed Dose: 45 Gy
Reference Point Dosage Given to Date: 32.4 Gy
Reference Point Session Dosage Given: 1.8 Gy
Session Number: 18

## 2023-07-12 ENCOUNTER — Ambulatory Visit
Admission: RE | Admit: 2023-07-12 | Discharge: 2023-07-12 | Disposition: A | Discharge status: Home / Self Care | Payer: Medicare Other | Source: Ambulatory Visit | Attending: Radiation Oncology

## 2023-07-12 ENCOUNTER — Other Ambulatory Visit: Payer: Self-pay

## 2023-07-12 ENCOUNTER — Ambulatory Visit: Payer: Medicare Other

## 2023-07-12 DIAGNOSIS — R9721 Rising PSA following treatment for malignant neoplasm of prostate: Secondary | ICD-10-CM | POA: Diagnosis not present

## 2023-07-12 DIAGNOSIS — Z510 Encounter for antineoplastic radiation therapy: Secondary | ICD-10-CM | POA: Diagnosis not present

## 2023-07-12 DIAGNOSIS — C61 Malignant neoplasm of prostate: Secondary | ICD-10-CM | POA: Diagnosis not present

## 2023-07-12 LAB — RAD ONC ARIA SESSION SUMMARY
Course Elapsed Days: 27
Plan Fractions Treated to Date: 19
Plan Prescribed Dose Per Fraction: 1.8 Gy
Plan Total Fractions Prescribed: 25
Plan Total Prescribed Dose: 45 Gy
Reference Point Dosage Given to Date: 34.2 Gy
Reference Point Session Dosage Given: 1.8 Gy
Session Number: 19

## 2023-07-13 ENCOUNTER — Ambulatory Visit: Payer: Medicare Other

## 2023-07-13 ENCOUNTER — Other Ambulatory Visit: Payer: Self-pay

## 2023-07-13 ENCOUNTER — Ambulatory Visit
Admission: RE | Admit: 2023-07-13 | Discharge: 2023-07-13 | Disposition: A | Discharge status: Home / Self Care | Payer: Medicare Other | Source: Ambulatory Visit | Attending: Radiation Oncology | Admitting: Radiation Oncology

## 2023-07-13 DIAGNOSIS — C61 Malignant neoplasm of prostate: Secondary | ICD-10-CM | POA: Diagnosis not present

## 2023-07-13 DIAGNOSIS — Z510 Encounter for antineoplastic radiation therapy: Secondary | ICD-10-CM | POA: Diagnosis not present

## 2023-07-13 DIAGNOSIS — R9721 Rising PSA following treatment for malignant neoplasm of prostate: Secondary | ICD-10-CM | POA: Diagnosis not present

## 2023-07-13 LAB — RAD ONC ARIA SESSION SUMMARY
Course Elapsed Days: 28
Plan Fractions Treated to Date: 20
Plan Prescribed Dose Per Fraction: 1.8 Gy
Plan Total Fractions Prescribed: 25
Plan Total Prescribed Dose: 45 Gy
Reference Point Dosage Given to Date: 36 Gy
Reference Point Session Dosage Given: 1.8 Gy
Session Number: 20

## 2023-07-14 ENCOUNTER — Ambulatory Visit: Payer: Medicare Other

## 2023-07-14 ENCOUNTER — Ambulatory Visit
Admission: RE | Admit: 2023-07-14 | Discharge: 2023-07-14 | Disposition: A | Discharge status: Home / Self Care | Payer: Medicare Other | Source: Ambulatory Visit | Attending: Radiation Oncology

## 2023-07-14 ENCOUNTER — Other Ambulatory Visit: Payer: Self-pay

## 2023-07-14 DIAGNOSIS — C61 Malignant neoplasm of prostate: Secondary | ICD-10-CM | POA: Diagnosis not present

## 2023-07-14 DIAGNOSIS — Z510 Encounter for antineoplastic radiation therapy: Secondary | ICD-10-CM | POA: Diagnosis not present

## 2023-07-14 DIAGNOSIS — R9721 Rising PSA following treatment for malignant neoplasm of prostate: Secondary | ICD-10-CM | POA: Diagnosis not present

## 2023-07-14 LAB — RAD ONC ARIA SESSION SUMMARY
Course Elapsed Days: 29
Plan Fractions Treated to Date: 21
Plan Prescribed Dose Per Fraction: 1.8 Gy
Plan Total Fractions Prescribed: 25
Plan Total Prescribed Dose: 45 Gy
Reference Point Dosage Given to Date: 37.8 Gy
Reference Point Session Dosage Given: 1.8 Gy
Session Number: 21

## 2023-07-17 ENCOUNTER — Ambulatory Visit: Payer: Medicare Other

## 2023-07-17 ENCOUNTER — Ambulatory Visit
Admission: RE | Admit: 2023-07-17 | Discharge: 2023-07-17 | Disposition: A | Discharge status: Home / Self Care | Payer: Medicare Other | Source: Ambulatory Visit | Attending: Radiation Oncology | Admitting: Radiation Oncology

## 2023-07-17 ENCOUNTER — Other Ambulatory Visit: Payer: Self-pay

## 2023-07-17 DIAGNOSIS — R9721 Rising PSA following treatment for malignant neoplasm of prostate: Secondary | ICD-10-CM | POA: Diagnosis not present

## 2023-07-17 DIAGNOSIS — Z510 Encounter for antineoplastic radiation therapy: Secondary | ICD-10-CM | POA: Diagnosis not present

## 2023-07-17 DIAGNOSIS — C61 Malignant neoplasm of prostate: Secondary | ICD-10-CM | POA: Diagnosis not present

## 2023-07-17 LAB — RAD ONC ARIA SESSION SUMMARY
Course Elapsed Days: 32
Plan Fractions Treated to Date: 22
Plan Prescribed Dose Per Fraction: 1.8 Gy
Plan Total Fractions Prescribed: 25
Plan Total Prescribed Dose: 45 Gy
Reference Point Dosage Given to Date: 39.6 Gy
Reference Point Session Dosage Given: 1.8 Gy
Session Number: 22

## 2023-07-18 ENCOUNTER — Other Ambulatory Visit: Payer: Self-pay

## 2023-07-18 ENCOUNTER — Ambulatory Visit: Payer: Medicare Other

## 2023-07-18 ENCOUNTER — Ambulatory Visit
Admission: RE | Admit: 2023-07-18 | Discharge: 2023-07-18 | Disposition: A | Discharge status: Home / Self Care | Payer: Medicare Other | Source: Ambulatory Visit | Attending: Radiation Oncology

## 2023-07-18 DIAGNOSIS — Z510 Encounter for antineoplastic radiation therapy: Secondary | ICD-10-CM | POA: Diagnosis not present

## 2023-07-18 DIAGNOSIS — C61 Malignant neoplasm of prostate: Secondary | ICD-10-CM | POA: Diagnosis not present

## 2023-07-18 DIAGNOSIS — R9721 Rising PSA following treatment for malignant neoplasm of prostate: Secondary | ICD-10-CM | POA: Diagnosis not present

## 2023-07-18 LAB — RAD ONC ARIA SESSION SUMMARY
Course Elapsed Days: 33
Plan Fractions Treated to Date: 23
Plan Prescribed Dose Per Fraction: 1.8 Gy
Plan Total Fractions Prescribed: 25
Plan Total Prescribed Dose: 45 Gy
Reference Point Dosage Given to Date: 41.4 Gy
Reference Point Session Dosage Given: 1.8 Gy
Session Number: 23

## 2023-07-19 ENCOUNTER — Ambulatory Visit: Payer: Medicare Other

## 2023-07-19 ENCOUNTER — Other Ambulatory Visit: Payer: Self-pay

## 2023-07-19 ENCOUNTER — Ambulatory Visit
Admission: RE | Admit: 2023-07-19 | Discharge: 2023-07-19 | Disposition: A | Discharge status: Home / Self Care | Payer: Medicare Other | Source: Ambulatory Visit | Attending: Radiation Oncology

## 2023-07-19 DIAGNOSIS — C61 Malignant neoplasm of prostate: Secondary | ICD-10-CM | POA: Diagnosis not present

## 2023-07-19 DIAGNOSIS — R9721 Rising PSA following treatment for malignant neoplasm of prostate: Secondary | ICD-10-CM | POA: Diagnosis not present

## 2023-07-19 DIAGNOSIS — Z510 Encounter for antineoplastic radiation therapy: Secondary | ICD-10-CM | POA: Diagnosis not present

## 2023-07-19 LAB — RAD ONC ARIA SESSION SUMMARY
Course Elapsed Days: 34
Plan Fractions Treated to Date: 24
Plan Prescribed Dose Per Fraction: 1.8 Gy
Plan Total Fractions Prescribed: 25
Plan Total Prescribed Dose: 45 Gy
Reference Point Dosage Given to Date: 43.2 Gy
Reference Point Session Dosage Given: 1.8 Gy
Session Number: 24

## 2023-07-20 ENCOUNTER — Ambulatory Visit: Payer: Medicare Other

## 2023-07-20 ENCOUNTER — Ambulatory Visit
Admission: RE | Admit: 2023-07-20 | Discharge: 2023-07-20 | Disposition: A | Discharge status: Home / Self Care | Payer: Medicare Other | Source: Ambulatory Visit | Attending: Radiation Oncology | Admitting: Radiation Oncology

## 2023-07-20 ENCOUNTER — Other Ambulatory Visit: Payer: Self-pay

## 2023-07-20 DIAGNOSIS — Z510 Encounter for antineoplastic radiation therapy: Secondary | ICD-10-CM | POA: Diagnosis not present

## 2023-07-20 DIAGNOSIS — R9721 Rising PSA following treatment for malignant neoplasm of prostate: Secondary | ICD-10-CM | POA: Diagnosis not present

## 2023-07-20 DIAGNOSIS — C61 Malignant neoplasm of prostate: Secondary | ICD-10-CM | POA: Diagnosis not present

## 2023-07-20 LAB — RAD ONC ARIA SESSION SUMMARY
Course Elapsed Days: 35
Plan Fractions Treated to Date: 25
Plan Prescribed Dose Per Fraction: 1.8 Gy
Plan Total Fractions Prescribed: 25
Plan Total Prescribed Dose: 45 Gy
Reference Point Dosage Given to Date: 45 Gy
Reference Point Session Dosage Given: 1.8 Gy
Session Number: 25

## 2023-07-21 ENCOUNTER — Ambulatory Visit
Admission: RE | Admit: 2023-07-21 | Discharge: 2023-07-21 | Disposition: A | Discharge status: Home / Self Care | Payer: Medicare Other | Source: Ambulatory Visit | Attending: Radiation Oncology | Admitting: Radiation Oncology

## 2023-07-21 ENCOUNTER — Ambulatory Visit
Admission: RE | Admit: 2023-07-21 | Discharge: 2023-07-21 | Disposition: A | Discharge status: Home / Self Care | Payer: Medicare Other | Source: Ambulatory Visit | Attending: Radiation Oncology

## 2023-07-21 ENCOUNTER — Ambulatory Visit: Payer: Medicare Other

## 2023-07-21 ENCOUNTER — Other Ambulatory Visit: Payer: Self-pay

## 2023-07-21 DIAGNOSIS — R9721 Rising PSA following treatment for malignant neoplasm of prostate: Secondary | ICD-10-CM | POA: Diagnosis not present

## 2023-07-21 DIAGNOSIS — Z510 Encounter for antineoplastic radiation therapy: Secondary | ICD-10-CM | POA: Diagnosis not present

## 2023-07-21 DIAGNOSIS — C61 Malignant neoplasm of prostate: Secondary | ICD-10-CM | POA: Diagnosis not present

## 2023-07-21 LAB — RAD ONC ARIA SESSION SUMMARY
Course Elapsed Days: 36
Plan Fractions Treated to Date: 1
Plan Prescribed Dose Per Fraction: 1.8 Gy
Plan Total Fractions Prescribed: 13
Plan Total Prescribed Dose: 23.4 Gy
Reference Point Dosage Given to Date: 1.8 Gy
Reference Point Session Dosage Given: 1.8 Gy
Session Number: 26

## 2023-07-24 ENCOUNTER — Ambulatory Visit: Payer: Medicare Other

## 2023-07-24 ENCOUNTER — Other Ambulatory Visit: Payer: Self-pay

## 2023-07-24 ENCOUNTER — Ambulatory Visit
Admission: RE | Admit: 2023-07-24 | Discharge: 2023-07-24 | Disposition: A | Discharge status: Home / Self Care | Payer: Medicare Other | Source: Ambulatory Visit | Attending: Radiation Oncology | Admitting: Radiation Oncology

## 2023-07-24 DIAGNOSIS — Z510 Encounter for antineoplastic radiation therapy: Secondary | ICD-10-CM | POA: Diagnosis not present

## 2023-07-24 DIAGNOSIS — C61 Malignant neoplasm of prostate: Secondary | ICD-10-CM | POA: Diagnosis not present

## 2023-07-24 DIAGNOSIS — R9721 Rising PSA following treatment for malignant neoplasm of prostate: Secondary | ICD-10-CM | POA: Diagnosis not present

## 2023-07-24 LAB — RAD ONC ARIA SESSION SUMMARY
Course Elapsed Days: 39
Plan Fractions Treated to Date: 2
Plan Prescribed Dose Per Fraction: 1.8 Gy
Plan Total Fractions Prescribed: 13
Plan Total Prescribed Dose: 23.4 Gy
Reference Point Dosage Given to Date: 3.6 Gy
Reference Point Session Dosage Given: 1.8 Gy
Session Number: 27

## 2023-07-25 ENCOUNTER — Ambulatory Visit: Payer: Medicare Other

## 2023-07-25 ENCOUNTER — Other Ambulatory Visit: Payer: Self-pay

## 2023-07-25 ENCOUNTER — Ambulatory Visit
Admission: RE | Admit: 2023-07-25 | Discharge: 2023-07-25 | Disposition: A | Discharge status: Home / Self Care | Payer: Medicare Other | Source: Ambulatory Visit | Attending: Radiation Oncology

## 2023-07-25 DIAGNOSIS — C61 Malignant neoplasm of prostate: Secondary | ICD-10-CM | POA: Diagnosis not present

## 2023-07-25 DIAGNOSIS — Z510 Encounter for antineoplastic radiation therapy: Secondary | ICD-10-CM | POA: Diagnosis not present

## 2023-07-25 DIAGNOSIS — R9721 Rising PSA following treatment for malignant neoplasm of prostate: Secondary | ICD-10-CM | POA: Diagnosis not present

## 2023-07-25 LAB — RAD ONC ARIA SESSION SUMMARY
Course Elapsed Days: 40
Plan Fractions Treated to Date: 3
Plan Prescribed Dose Per Fraction: 1.8 Gy
Plan Total Fractions Prescribed: 13
Plan Total Prescribed Dose: 23.4 Gy
Reference Point Dosage Given to Date: 5.4 Gy
Reference Point Session Dosage Given: 1.8 Gy
Session Number: 28

## 2023-07-26 ENCOUNTER — Ambulatory Visit: Payer: Medicare Other

## 2023-07-27 ENCOUNTER — Ambulatory Visit
Admission: RE | Admit: 2023-07-27 | Discharge: 2023-07-27 | Disposition: A | Discharge status: Home / Self Care | Payer: Medicare Other | Source: Ambulatory Visit | Attending: Radiation Oncology | Admitting: Radiation Oncology

## 2023-07-27 ENCOUNTER — Other Ambulatory Visit: Payer: Self-pay

## 2023-07-27 ENCOUNTER — Ambulatory Visit: Payer: Medicare Other

## 2023-07-27 DIAGNOSIS — Z510 Encounter for antineoplastic radiation therapy: Secondary | ICD-10-CM | POA: Diagnosis not present

## 2023-07-27 DIAGNOSIS — C61 Malignant neoplasm of prostate: Secondary | ICD-10-CM | POA: Diagnosis not present

## 2023-07-27 DIAGNOSIS — R9721 Rising PSA following treatment for malignant neoplasm of prostate: Secondary | ICD-10-CM | POA: Diagnosis not present

## 2023-07-27 LAB — RAD ONC ARIA SESSION SUMMARY
Course Elapsed Days: 42
Plan Fractions Treated to Date: 4
Plan Prescribed Dose Per Fraction: 1.8 Gy
Plan Total Fractions Prescribed: 13
Plan Total Prescribed Dose: 23.4 Gy
Reference Point Dosage Given to Date: 7.2 Gy
Reference Point Session Dosage Given: 1.8 Gy
Session Number: 29

## 2023-07-28 ENCOUNTER — Ambulatory Visit
Admission: RE | Admit: 2023-07-28 | Discharge: 2023-07-28 | Disposition: A | Discharge status: Home / Self Care | Payer: Medicare Other | Source: Ambulatory Visit | Attending: Radiation Oncology

## 2023-07-28 ENCOUNTER — Ambulatory Visit: Payer: Medicare Other

## 2023-07-28 ENCOUNTER — Other Ambulatory Visit: Payer: Self-pay

## 2023-07-28 DIAGNOSIS — R9721 Rising PSA following treatment for malignant neoplasm of prostate: Secondary | ICD-10-CM | POA: Diagnosis not present

## 2023-07-28 DIAGNOSIS — C61 Malignant neoplasm of prostate: Secondary | ICD-10-CM | POA: Diagnosis not present

## 2023-07-28 DIAGNOSIS — Z510 Encounter for antineoplastic radiation therapy: Secondary | ICD-10-CM | POA: Diagnosis not present

## 2023-07-28 LAB — RAD ONC ARIA SESSION SUMMARY
Course Elapsed Days: 43
Plan Fractions Treated to Date: 5
Plan Prescribed Dose Per Fraction: 1.8 Gy
Plan Total Fractions Prescribed: 13
Plan Total Prescribed Dose: 23.4 Gy
Reference Point Dosage Given to Date: 9 Gy
Reference Point Session Dosage Given: 1.8 Gy
Session Number: 30

## 2023-07-31 ENCOUNTER — Ambulatory Visit
Admission: RE | Admit: 2023-07-31 | Discharge: 2023-07-31 | Disposition: A | Discharge status: Home / Self Care | Payer: Medicare Other | Source: Ambulatory Visit | Attending: Radiation Oncology | Admitting: Radiation Oncology

## 2023-07-31 ENCOUNTER — Other Ambulatory Visit: Payer: Self-pay

## 2023-07-31 ENCOUNTER — Ambulatory Visit: Payer: Medicare Other

## 2023-07-31 DIAGNOSIS — Z510 Encounter for antineoplastic radiation therapy: Secondary | ICD-10-CM | POA: Diagnosis not present

## 2023-07-31 DIAGNOSIS — R9721 Rising PSA following treatment for malignant neoplasm of prostate: Secondary | ICD-10-CM | POA: Diagnosis not present

## 2023-07-31 DIAGNOSIS — C61 Malignant neoplasm of prostate: Secondary | ICD-10-CM | POA: Diagnosis not present

## 2023-07-31 LAB — RAD ONC ARIA SESSION SUMMARY
Course Elapsed Days: 46
Plan Fractions Treated to Date: 6
Plan Prescribed Dose Per Fraction: 1.8 Gy
Plan Total Fractions Prescribed: 13
Plan Total Prescribed Dose: 23.4 Gy
Reference Point Dosage Given to Date: 10.8 Gy
Reference Point Session Dosage Given: 1.8 Gy
Session Number: 31

## 2023-08-01 ENCOUNTER — Ambulatory Visit: Payer: Medicare Other

## 2023-08-01 ENCOUNTER — Ambulatory Visit
Admission: RE | Admit: 2023-08-01 | Discharge: 2023-08-01 | Disposition: A | Discharge status: Home / Self Care | Payer: Medicare Other | Source: Ambulatory Visit | Attending: Radiation Oncology | Admitting: Radiation Oncology

## 2023-08-01 ENCOUNTER — Other Ambulatory Visit: Payer: Self-pay

## 2023-08-01 DIAGNOSIS — Z510 Encounter for antineoplastic radiation therapy: Secondary | ICD-10-CM | POA: Diagnosis not present

## 2023-08-01 DIAGNOSIS — R9721 Rising PSA following treatment for malignant neoplasm of prostate: Secondary | ICD-10-CM | POA: Diagnosis not present

## 2023-08-01 DIAGNOSIS — C61 Malignant neoplasm of prostate: Secondary | ICD-10-CM | POA: Diagnosis not present

## 2023-08-01 LAB — RAD ONC ARIA SESSION SUMMARY
Course Elapsed Days: 47
Plan Fractions Treated to Date: 7
Plan Prescribed Dose Per Fraction: 1.8 Gy
Plan Total Fractions Prescribed: 13
Plan Total Prescribed Dose: 23.4 Gy
Reference Point Dosage Given to Date: 12.6 Gy
Reference Point Session Dosage Given: 1.8 Gy
Session Number: 32

## 2023-08-02 ENCOUNTER — Other Ambulatory Visit: Payer: Self-pay

## 2023-08-02 ENCOUNTER — Ambulatory Visit: Payer: Medicare Other

## 2023-08-02 ENCOUNTER — Ambulatory Visit
Admission: RE | Admit: 2023-08-02 | Discharge: 2023-08-02 | Disposition: A | Discharge status: Home / Self Care | Payer: Medicare Other | Source: Ambulatory Visit | Attending: Radiation Oncology | Admitting: Radiation Oncology

## 2023-08-02 DIAGNOSIS — R9721 Rising PSA following treatment for malignant neoplasm of prostate: Secondary | ICD-10-CM | POA: Diagnosis not present

## 2023-08-02 DIAGNOSIS — C61 Malignant neoplasm of prostate: Secondary | ICD-10-CM | POA: Diagnosis not present

## 2023-08-02 DIAGNOSIS — Z510 Encounter for antineoplastic radiation therapy: Secondary | ICD-10-CM | POA: Diagnosis not present

## 2023-08-02 LAB — RAD ONC ARIA SESSION SUMMARY
Course Elapsed Days: 48
Plan Fractions Treated to Date: 8
Plan Prescribed Dose Per Fraction: 1.8 Gy
Plan Total Fractions Prescribed: 13
Plan Total Prescribed Dose: 23.4 Gy
Reference Point Dosage Given to Date: 14.4 Gy
Reference Point Session Dosage Given: 1.8 Gy
Session Number: 33

## 2023-08-03 ENCOUNTER — Ambulatory Visit: Payer: Medicare Other

## 2023-08-03 ENCOUNTER — Other Ambulatory Visit: Payer: Self-pay

## 2023-08-03 ENCOUNTER — Ambulatory Visit
Admission: RE | Admit: 2023-08-03 | Discharge: 2023-08-03 | Disposition: A | Discharge status: Home / Self Care | Payer: Medicare Other | Source: Ambulatory Visit | Attending: Radiation Oncology | Admitting: Radiation Oncology

## 2023-08-03 DIAGNOSIS — Z510 Encounter for antineoplastic radiation therapy: Secondary | ICD-10-CM | POA: Diagnosis not present

## 2023-08-03 DIAGNOSIS — R9721 Rising PSA following treatment for malignant neoplasm of prostate: Secondary | ICD-10-CM | POA: Diagnosis not present

## 2023-08-03 DIAGNOSIS — C61 Malignant neoplasm of prostate: Secondary | ICD-10-CM | POA: Diagnosis not present

## 2023-08-03 LAB — RAD ONC ARIA SESSION SUMMARY
Course Elapsed Days: 49
Plan Fractions Treated to Date: 9
Plan Prescribed Dose Per Fraction: 1.8 Gy
Plan Total Fractions Prescribed: 13
Plan Total Prescribed Dose: 23.4 Gy
Reference Point Dosage Given to Date: 16.2 Gy
Reference Point Session Dosage Given: 1.8 Gy
Session Number: 34

## 2023-08-04 ENCOUNTER — Ambulatory Visit: Payer: Medicare Other

## 2023-08-04 ENCOUNTER — Other Ambulatory Visit: Payer: Self-pay

## 2023-08-04 ENCOUNTER — Ambulatory Visit
Admission: RE | Admit: 2023-08-04 | Discharge: 2023-08-04 | Disposition: A | Discharge status: Home / Self Care | Payer: Medicare Other | Source: Ambulatory Visit | Attending: Radiation Oncology

## 2023-08-04 DIAGNOSIS — Z510 Encounter for antineoplastic radiation therapy: Secondary | ICD-10-CM | POA: Diagnosis not present

## 2023-08-04 DIAGNOSIS — R9721 Rising PSA following treatment for malignant neoplasm of prostate: Secondary | ICD-10-CM | POA: Diagnosis not present

## 2023-08-04 DIAGNOSIS — C61 Malignant neoplasm of prostate: Secondary | ICD-10-CM | POA: Diagnosis not present

## 2023-08-04 LAB — RAD ONC ARIA SESSION SUMMARY
Course Elapsed Days: 50
Plan Fractions Treated to Date: 10
Plan Prescribed Dose Per Fraction: 1.8 Gy
Plan Total Fractions Prescribed: 13
Plan Total Prescribed Dose: 23.4 Gy
Reference Point Dosage Given to Date: 18 Gy
Reference Point Session Dosage Given: 1.8 Gy
Session Number: 35

## 2023-08-07 ENCOUNTER — Ambulatory Visit: Payer: Medicare Other

## 2023-08-07 ENCOUNTER — Other Ambulatory Visit: Payer: Self-pay

## 2023-08-07 ENCOUNTER — Ambulatory Visit
Admission: RE | Admit: 2023-08-07 | Discharge: 2023-08-07 | Disposition: A | Discharge status: Home / Self Care | Payer: Medicare Other | Source: Ambulatory Visit | Attending: Radiation Oncology

## 2023-08-07 DIAGNOSIS — R9721 Rising PSA following treatment for malignant neoplasm of prostate: Secondary | ICD-10-CM | POA: Diagnosis not present

## 2023-08-07 DIAGNOSIS — C61 Malignant neoplasm of prostate: Secondary | ICD-10-CM | POA: Diagnosis not present

## 2023-08-07 DIAGNOSIS — Z510 Encounter for antineoplastic radiation therapy: Secondary | ICD-10-CM | POA: Diagnosis not present

## 2023-08-07 LAB — RAD ONC ARIA SESSION SUMMARY
Course Elapsed Days: 53
Plan Fractions Treated to Date: 11
Plan Prescribed Dose Per Fraction: 1.8 Gy
Plan Total Fractions Prescribed: 13
Plan Total Prescribed Dose: 23.4 Gy
Reference Point Dosage Given to Date: 19.8 Gy
Reference Point Session Dosage Given: 1.8 Gy
Session Number: 36

## 2023-08-08 ENCOUNTER — Ambulatory Visit: Payer: Medicare Other

## 2023-08-08 ENCOUNTER — Ambulatory Visit
Admission: RE | Admit: 2023-08-08 | Discharge: 2023-08-08 | Disposition: A | Discharge status: Home / Self Care | Payer: Medicare Other | Source: Ambulatory Visit | Attending: Radiation Oncology

## 2023-08-08 ENCOUNTER — Other Ambulatory Visit: Payer: Self-pay

## 2023-08-08 ENCOUNTER — Ambulatory Visit
Admission: RE | Admit: 2023-08-08 | Discharge: 2023-08-08 | Disposition: A | Discharge status: Home / Self Care | Payer: Medicare Other | Source: Ambulatory Visit | Attending: Radiation Oncology | Admitting: Radiation Oncology

## 2023-08-08 DIAGNOSIS — Z510 Encounter for antineoplastic radiation therapy: Secondary | ICD-10-CM | POA: Diagnosis not present

## 2023-08-08 DIAGNOSIS — R9721 Rising PSA following treatment for malignant neoplasm of prostate: Secondary | ICD-10-CM | POA: Diagnosis not present

## 2023-08-08 DIAGNOSIS — C61 Malignant neoplasm of prostate: Secondary | ICD-10-CM | POA: Diagnosis not present

## 2023-08-08 LAB — RAD ONC ARIA SESSION SUMMARY
Course Elapsed Days: 54
Plan Fractions Treated to Date: 12
Plan Prescribed Dose Per Fraction: 1.8 Gy
Plan Total Fractions Prescribed: 13
Plan Total Prescribed Dose: 23.4 Gy
Reference Point Dosage Given to Date: 21.6 Gy
Reference Point Session Dosage Given: 1.8 Gy
Session Number: 37

## 2023-08-09 ENCOUNTER — Other Ambulatory Visit: Payer: Self-pay

## 2023-08-09 ENCOUNTER — Ambulatory Visit
Admission: RE | Admit: 2023-08-09 | Discharge: 2023-08-09 | Disposition: A | Discharge status: Home / Self Care | Payer: Medicare Other | Source: Ambulatory Visit | Attending: Radiation Oncology | Admitting: Radiation Oncology

## 2023-08-09 DIAGNOSIS — R9721 Rising PSA following treatment for malignant neoplasm of prostate: Secondary | ICD-10-CM | POA: Diagnosis not present

## 2023-08-09 DIAGNOSIS — C61 Malignant neoplasm of prostate: Secondary | ICD-10-CM | POA: Diagnosis not present

## 2023-08-09 DIAGNOSIS — Z510 Encounter for antineoplastic radiation therapy: Secondary | ICD-10-CM | POA: Diagnosis not present

## 2023-08-09 LAB — RAD ONC ARIA SESSION SUMMARY
Course Elapsed Days: 55
Plan Fractions Treated to Date: 13
Plan Prescribed Dose Per Fraction: 1.8 Gy
Plan Total Fractions Prescribed: 13
Plan Total Prescribed Dose: 23.4 Gy
Reference Point Dosage Given to Date: 23.4 Gy
Reference Point Session Dosage Given: 1.8 Gy
Session Number: 38

## 2023-08-10 NOTE — Radiation Completion Notes (Addendum)
  Radiation Oncology         [phone]) [phone] ________________________________  Name: Kerry Vasquez MRN: [id number]  Date: 08/09/2023  DOB: 1951/08/30  Referring Physician: Bjorn Pippin, M.D. Date of Service: 2023-08-10 Radiation Oncologist: Margaretmary Bayley, M.D. [facility]     RADIATION ONCOLOGY END OF TREATMENT NOTE     Diagnosis: 72 y/o man with biochemically recurrent prostate cancer with PSA at 0.5 s/p RRP in 03/2004 for pT2/3,N0 Gleason 3+4 adenocarcinoma of the prostate   Intent: Curative     ==========DELIVERED PLANS==========  First Treatment Date: 2023-06-15 - Last Treatment Date: 2023-08-09   Plan Name: ProstBed Site: Prostate Bed Technique: IMRT Mode: Photon Dose Per Fraction: 1.8 Gy Prescribed Dose (Delivered / Prescribed): 45 Gy / 45 Gy Prescribed Fxs (Delivered / Prescribed): 25 / 25   Plan Name: ProstBed_Bst Site: Prostate Bed Technique: IMRT Mode: Photon Dose Per Fraction: 1.8 Gy Prescribed Dose (Delivered / Prescribed): 23.4 Gy / 23.4 Gy Prescribed Fxs (Delivered / Prescribed): 13 / 13     ==========ON TREATMENT VISIT DATES========== 2023-06-16, 2023-06-23, 2023-06-30, 2023-07-07, 2023-07-17, 2023-07-21, 2023-08-03, 2023-08-08     See weekly On Treatment Notes in Epic for details.  He tolerated the daily radiation treatments well with only mild urinary symptoms of increased nocturia and some modest fatigue.  The patient will receive a call in about one month from the radiation oncology department. He will continue follow up with Dr. Annabell Howells as well.  ------------------------------------------------   Margaretmary Dys, MD [facility]  Radiation Oncology Direct Dial: [phone]  Fax: [phone] [email or web address]  Skype  LinkedIn

## 2023-08-11 NOTE — Progress Notes (Signed)
Patient was a RadOnc Consult on 04/07/23 for his biochemically recurrent prostate cancer with PSA at 0.5 s/p RRP in 03/2004 for pT2/3,N0 Gleason 3+4 adenocarcinoma of the prostate.  Patient proceed with treatment recommendations of 7.5 weeks of salvage external beam therapy without ADT and had his final radiation treatment on 08/09/23.   Patient is scheduled for a post treatment nurse call on 09/26/23 and has his follow up with urologist Dr. Annabell Howells on 11/09/23.   RN left message for call back if patient has any questions related to post treatment follow up's.

## 2023-08-16 DIAGNOSIS — Z23 Encounter for immunization: Secondary | ICD-10-CM | POA: Diagnosis not present

## 2023-08-16 DIAGNOSIS — Z0000 Encounter for general adult medical examination without abnormal findings: Secondary | ICD-10-CM | POA: Diagnosis not present

## 2023-08-16 DIAGNOSIS — Z8546 Personal history of malignant neoplasm of prostate: Secondary | ICD-10-CM | POA: Diagnosis not present

## 2023-08-16 DIAGNOSIS — I10 Essential (primary) hypertension: Secondary | ICD-10-CM | POA: Diagnosis not present

## 2023-08-16 DIAGNOSIS — E1169 Type 2 diabetes mellitus with other specified complication: Secondary | ICD-10-CM | POA: Diagnosis not present

## 2023-09-15 ENCOUNTER — Other Ambulatory Visit: Payer: Self-pay | Admitting: Urology

## 2023-09-15 DIAGNOSIS — R9721 Rising PSA following treatment for malignant neoplasm of prostate: Secondary | ICD-10-CM

## 2023-09-15 DIAGNOSIS — C61 Malignant neoplasm of prostate: Secondary | ICD-10-CM

## 2023-09-18 ENCOUNTER — Other Ambulatory Visit: Payer: Medicare Other

## 2023-09-19 DIAGNOSIS — H2513 Age-related nuclear cataract, bilateral: Secondary | ICD-10-CM | POA: Diagnosis not present

## 2023-09-19 DIAGNOSIS — E119 Type 2 diabetes mellitus without complications: Secondary | ICD-10-CM | POA: Diagnosis not present

## 2023-09-19 DIAGNOSIS — H538 Other visual disturbances: Secondary | ICD-10-CM | POA: Diagnosis not present

## 2023-09-19 DIAGNOSIS — H04129 Dry eye syndrome of unspecified lacrimal gland: Secondary | ICD-10-CM | POA: Diagnosis not present

## 2023-09-21 ENCOUNTER — Encounter: Payer: Self-pay | Admitting: *Deleted

## 2023-09-21 ENCOUNTER — Ambulatory Visit: Payer: Medicare Other | Admitting: Urology

## 2023-09-22 DIAGNOSIS — Z23 Encounter for immunization: Secondary | ICD-10-CM | POA: Diagnosis not present

## 2023-09-26 ENCOUNTER — Ambulatory Visit
Admission: RE | Admit: 2023-09-26 | Discharge: 2023-09-26 | Disposition: A | Discharge status: Home / Self Care | Payer: Medicare Other | Source: Ambulatory Visit | Attending: Radiation Oncology | Admitting: Radiation Oncology

## 2023-09-26 NOTE — Progress Notes (Addendum)
  Radiation Oncology         [phone] ________________________________  Name: Kerry Vasquez MRN: [id number]  Date of Service: 09/26/2023  DOB: 05-14-1951  Post Treatment Telephone Note  Diagnosis: 72 y/o man with biochemically recurrent prostate cancer with PSA at 0.5 s/p RRP in 03/2004 for pT2/3,N0 Gleason 3+4 adenocarcinoma of the prostate (as documented in provider EOT note)  Pre Treatment IPSS Score: 5 (as documented in the provider consult note)  The patient was not available for call today. Voicemail left.  Patient does not currently have any scheduled follow up with his urologist to his knowledge so he was advised to call [facility] to schedule his post-treatment follow up with Dr. Annabell Howells for ongoing surveillance. He was counseled that PSA levels will be drawn in the urology office, and was reassured that additional time is expected to improve bowel and bladder symptoms. He was encouraged to call back with concerns or questions regarding radiation.   This concludes the interaction.  Ruel Favors, LPN

## 2023-10-02 ENCOUNTER — Other Ambulatory Visit: Payer: Medicare Other

## 2023-10-02 DIAGNOSIS — C61 Malignant neoplasm of prostate: Secondary | ICD-10-CM | POA: Diagnosis not present

## 2023-10-03 LAB — PSA: Prostate Specific Ag, Serum: <0.1 ng/mL (ref 0.0–4.0)

## 2023-10-12 ENCOUNTER — Inpatient Hospital Stay: Payer: Medicare Other | Attending: Adult Health | Admitting: *Deleted

## 2023-10-12 ENCOUNTER — Encounter: Payer: Self-pay | Admitting: *Deleted

## 2023-10-12 DIAGNOSIS — C61 Malignant neoplasm of prostate: Secondary | ICD-10-CM

## 2023-10-12 NOTE — Progress Notes (Signed)
SCP reviewed and completed. Most recent PSA labs < 0.1 on 10/02/2023. Last colonoscopy  was 12/2013.

## 2023-10-30 DIAGNOSIS — G4733 Obstructive sleep apnea (adult) (pediatric): Secondary | ICD-10-CM | POA: Diagnosis not present

## 2023-11-09 ENCOUNTER — Ambulatory Visit: Payer: Medicare Other | Admitting: Urology

## 2023-11-23 ENCOUNTER — Encounter: Payer: Self-pay | Admitting: Urology

## 2023-11-23 ENCOUNTER — Ambulatory Visit (INDEPENDENT_AMBULATORY_CARE_PROVIDER_SITE_OTHER): Payer: Medicare Other | Admitting: Urology

## 2023-11-23 VITALS — BP 160/69 | HR 60

## 2023-11-23 DIAGNOSIS — Z8546 Personal history of malignant neoplasm of prostate: Secondary | ICD-10-CM | POA: Diagnosis not present

## 2023-11-23 DIAGNOSIS — R351 Nocturia: Secondary | ICD-10-CM

## 2023-11-23 DIAGNOSIS — C61 Malignant neoplasm of prostate: Secondary | ICD-10-CM | POA: Diagnosis not present

## 2023-11-23 LAB — URINALYSIS, ROUTINE W REFLEX MICROSCOPIC
Bilirubin, UA: NEGATIVE
Glucose, UA: NEGATIVE
Ketones, UA: NEGATIVE
Leukocytes,UA: NEGATIVE
Nitrite, UA: NEGATIVE
Protein,UA: NEGATIVE
RBC, UA: NEGATIVE
Specific Gravity, UA: 1.01 (ref 1.005–1.030)
Urobilinogen, Ur: 0.2 mg/dL (ref 0.2–1.0)
pH, UA: 7 (ref 5.0–7.5)
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
## 2023-12-08 DIAGNOSIS — J988 Other specified respiratory disorders: Secondary | ICD-10-CM | POA: Diagnosis not present

## 2023-12-28 ENCOUNTER — Ambulatory Visit: Payer: Medicare Other | Admitting: Urology

## 2024-01-01 DIAGNOSIS — J988 Other specified respiratory disorders: Secondary | ICD-10-CM | POA: Diagnosis not present

## 2024-02-19 DIAGNOSIS — Z1211 Encounter for screening for malignant neoplasm of colon: Secondary | ICD-10-CM | POA: Diagnosis not present

## 2024-02-19 DIAGNOSIS — I10 Essential (primary) hypertension: Secondary | ICD-10-CM | POA: Diagnosis not present

## 2024-02-19 DIAGNOSIS — Z8546 Personal history of malignant neoplasm of prostate: Secondary | ICD-10-CM | POA: Diagnosis not present

## 2024-02-19 DIAGNOSIS — E782 Mixed hyperlipidemia: Secondary | ICD-10-CM | POA: Diagnosis not present

## 2024-02-19 DIAGNOSIS — E118 Type 2 diabetes mellitus with unspecified complications: Secondary | ICD-10-CM | POA: Diagnosis not present

## 2024-03-28 DIAGNOSIS — Z1211 Encounter for screening for malignant neoplasm of colon: Secondary | ICD-10-CM | POA: Diagnosis not present

## 2024-03-28 DIAGNOSIS — K6389 Other specified diseases of intestine: Secondary | ICD-10-CM | POA: Diagnosis not present

## 2024-03-28 DIAGNOSIS — D124 Benign neoplasm of descending colon: Secondary | ICD-10-CM | POA: Diagnosis not present

## 2024-03-28 DIAGNOSIS — D122 Benign neoplasm of ascending colon: Secondary | ICD-10-CM | POA: Diagnosis not present

## 2024-04-01 DIAGNOSIS — D124 Benign neoplasm of descending colon: Secondary | ICD-10-CM | POA: Diagnosis not present

## 2024-04-01 DIAGNOSIS — D122 Benign neoplasm of ascending colon: Secondary | ICD-10-CM | POA: Diagnosis not present

## 2024-05-16 ENCOUNTER — Other Ambulatory Visit: Payer: Medicare Other

## 2024-05-16 DIAGNOSIS — Z8546 Personal history of malignant neoplasm of prostate: Secondary | ICD-10-CM

## 2024-05-17 ENCOUNTER — Ambulatory Visit: Payer: Self-pay

## 2024-05-17 LAB — PSA: Prostate Specific Ag, Serum: <0.1 ng/mL (ref 0.0–4.0)

## 2024-05-23 ENCOUNTER — Ambulatory Visit: Payer: Medicare Other | Admitting: Urology
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
## 2024-05-28 ENCOUNTER — Ambulatory Visit (INDEPENDENT_AMBULATORY_CARE_PROVIDER_SITE_OTHER): Admitting: Urology

## 2024-05-28 VITALS — BP 148/64 | HR 72

## 2024-05-28 DIAGNOSIS — R351 Nocturia: Secondary | ICD-10-CM | POA: Diagnosis not present

## 2024-05-28 DIAGNOSIS — Z08 Encounter for follow-up examination after completed treatment for malignant neoplasm: Secondary | ICD-10-CM | POA: Diagnosis not present

## 2024-05-28 DIAGNOSIS — Z8546 Personal history of malignant neoplasm of prostate: Secondary | ICD-10-CM | POA: Diagnosis not present

## 2024-05-28 DIAGNOSIS — N5201 Erectile dysfunction due to arterial insufficiency: Secondary | ICD-10-CM

## 2024-05-28 DIAGNOSIS — R9721 Rising PSA following treatment for malignant neoplasm of prostate: Secondary | ICD-10-CM

## 2024-05-28 LAB — URINALYSIS, ROUTINE W REFLEX MICROSCOPIC
Bilirubin, UA: NEGATIVE
Glucose, UA: NEGATIVE
Ketones, UA: NEGATIVE
Leukocytes,UA: NEGATIVE
Nitrite, UA: NEGATIVE
Protein,UA: NEGATIVE
RBC, UA: NEGATIVE
Specific Gravity, UA: 1.015 (ref 1.005–1.030)
Urobilinogen, Ur: 1 mg/dL (ref 0.2–1.0)
pH, UA: 7 (ref 5.0–7.5)

## 2024-07-03 DIAGNOSIS — Z23 Encounter for immunization: Secondary | ICD-10-CM | POA: Diagnosis not present

## 2024-07-25 DIAGNOSIS — Z23 Encounter for immunization: Secondary | ICD-10-CM | POA: Diagnosis not present

## 2024-08-16 DIAGNOSIS — E782 Mixed hyperlipidemia: Secondary | ICD-10-CM | POA: Diagnosis not present

## 2024-08-16 DIAGNOSIS — Z1331 Encounter for screening for depression: Secondary | ICD-10-CM | POA: Diagnosis not present

## 2024-08-16 DIAGNOSIS — Z8546 Personal history of malignant neoplasm of prostate: Secondary | ICD-10-CM | POA: Diagnosis not present

## 2024-08-16 DIAGNOSIS — Z0000 Encounter for general adult medical examination without abnormal findings: Secondary | ICD-10-CM | POA: Diagnosis not present

## 2024-08-16 DIAGNOSIS — G4733 Obstructive sleep apnea (adult) (pediatric): Secondary | ICD-10-CM | POA: Diagnosis not present

## 2024-08-16 DIAGNOSIS — Z125 Encounter for screening for malignant neoplasm of prostate: Secondary | ICD-10-CM | POA: Diagnosis not present

## 2024-08-16 DIAGNOSIS — L989 Disorder of the skin and subcutaneous tissue, unspecified: Secondary | ICD-10-CM | POA: Diagnosis not present

## 2024-08-16 DIAGNOSIS — E118 Type 2 diabetes mellitus with unspecified complications: Secondary | ICD-10-CM | POA: Diagnosis not present

## 2024-08-16 DIAGNOSIS — I10 Essential (primary) hypertension: Secondary | ICD-10-CM | POA: Diagnosis not present

## 2024-09-17 DIAGNOSIS — H2513 Age-related nuclear cataract, bilateral: Secondary | ICD-10-CM | POA: Diagnosis not present

## 2024-09-17 DIAGNOSIS — H538 Other visual disturbances: Secondary | ICD-10-CM | POA: Diagnosis not present

## 2024-09-17 DIAGNOSIS — E119 Type 2 diabetes mellitus without complications: Secondary | ICD-10-CM | POA: Diagnosis not present

## 2024-09-17 DIAGNOSIS — H524 Presbyopia: Secondary | ICD-10-CM | POA: Diagnosis not present

## 2024-10-07 ENCOUNTER — Telehealth: Payer: Self-pay

## 2024-10-07 NOTE — Telephone Encounter (Signed)
 Attempted to reach patient concerning colonoscopy recall; unable to speak with patient;  left message and number to the office for patient to call back and schedule appts;

## 2024-10-23 NOTE — Telephone Encounter (Signed)
 Attempted to reach patient concerning colonoscopy recall; unable to speak with patient;  unable to leave a message for patient to call back to the office; will attempt to reach patient at a later date/time;

## 2024-12-03 ENCOUNTER — Other Ambulatory Visit

## 2024-12-03 DIAGNOSIS — Z8546 Personal history of malignant neoplasm of prostate: Secondary | ICD-10-CM

## 2024-12-04 ENCOUNTER — Ambulatory Visit: Payer: Self-pay | Admitting: Urology

## 2024-12-04 LAB — PSA: Prostate Specific Ag, Serum: <0.1 ng/mL (ref 0.0–4.0)

## 2024-12-10 ENCOUNTER — Ambulatory Visit: Admitting: Urology

## 2025-03-19 ENCOUNTER — Ambulatory Visit: Admitting: Urology
